# Patient Record
Sex: Male | Born: 1960 | Race: White | Hispanic: No | Marital: Married | Smoking: Never smoker
Health system: Southern US, Community
[De-identification: ages and names within clinical notes are randomized; demographics above are authoritative.]

## PROBLEM LIST (undated history)

## (undated) DIAGNOSIS — T4145XA Adverse effect of unspecified anesthetic, initial encounter: Secondary | ICD-10-CM

## (undated) DIAGNOSIS — K4090 Unilateral inguinal hernia, without obstruction or gangrene, not specified as recurrent: Secondary | ICD-10-CM

## (undated) DIAGNOSIS — Z8489 Family history of other specified conditions: Secondary | ICD-10-CM

## (undated) DIAGNOSIS — T8859XA Other complications of anesthesia, initial encounter: Secondary | ICD-10-CM

## (undated) DIAGNOSIS — E7800 Pure hypercholesterolemia, unspecified: Secondary | ICD-10-CM

## (undated) DIAGNOSIS — N200 Calculus of kidney: Secondary | ICD-10-CM

## (undated) DIAGNOSIS — I2510 Atherosclerotic heart disease of native coronary artery without angina pectoris: Secondary | ICD-10-CM

## (undated) HISTORY — DX: Pure hypercholesterolemia, unspecified: E78.00

## (undated) HISTORY — PX: OTHER SURGICAL HISTORY: SHX169

## (undated) HISTORY — DX: Atherosclerotic heart disease of native coronary artery without angina pectoris: I25.10

## (undated) HISTORY — PX: HERNIA REPAIR: SHX51

## (undated) HISTORY — PX: CARDIAC CATHETERIZATION: SHX172

---

## 2013-07-08 ENCOUNTER — Other Ambulatory Visit: Payer: Self-pay | Admitting: Cardiology

## 2013-07-09 ENCOUNTER — Ambulatory Visit (HOSPITAL_COMMUNITY)
Admission: RE | Admit: 2013-07-09 | Discharge: 2013-07-09 | Disposition: A | Discharge status: Home / Self Care | Payer: 59 | Source: Ambulatory Visit | Attending: Cardiology | Admitting: Cardiology

## 2013-07-09 ENCOUNTER — Encounter (HOSPITAL_COMMUNITY)
Admission: RE | Disposition: A | Discharge status: Home / Self Care | Payer: Self-pay | Source: Ambulatory Visit | Attending: Cardiology

## 2013-07-09 DIAGNOSIS — E669 Obesity, unspecified: Secondary | ICD-10-CM | POA: Diagnosis present

## 2013-07-09 DIAGNOSIS — I209 Angina pectoris, unspecified: Secondary | ICD-10-CM | POA: Insufficient documentation

## 2013-07-09 DIAGNOSIS — R9439 Abnormal result of other cardiovascular function study: Secondary | ICD-10-CM | POA: Diagnosis present

## 2013-07-09 DIAGNOSIS — I208 Other forms of angina pectoris: Secondary | ICD-10-CM | POA: Diagnosis present

## 2013-07-09 DIAGNOSIS — I2089 Other forms of angina pectoris: Secondary | ICD-10-CM | POA: Diagnosis present

## 2013-07-09 DIAGNOSIS — I2510 Atherosclerotic heart disease of native coronary artery without angina pectoris: Secondary | ICD-10-CM | POA: Diagnosis present

## 2013-07-09 DIAGNOSIS — E785 Hyperlipidemia, unspecified: Secondary | ICD-10-CM | POA: Diagnosis present

## 2013-07-09 DIAGNOSIS — E7800 Pure hypercholesterolemia, unspecified: Secondary | ICD-10-CM | POA: Insufficient documentation

## 2013-07-09 DIAGNOSIS — I10 Essential (primary) hypertension: Secondary | ICD-10-CM | POA: Diagnosis present

## 2013-07-09 HISTORY — PX: FRACTIONAL FLOW RESERVE WIRE: SHX5839

## 2013-07-09 HISTORY — PX: LEFT HEART CATHETERIZATION WITH CORONARY ANGIOGRAM: SHX5451

## 2013-07-09 SURGERY — LEFT HEART CATHETERIZATION WITH CORONARY ANGIOGRAM
Anesthesia: LOCAL

## 2013-07-09 MED ORDER — SODIUM CHLORIDE 0.9 % IV SOLN
INTRAVENOUS | Status: DC
  Administered 2013-07-09: 08:00:00 via INTRAVENOUS

## 2013-07-09 MED ORDER — HEPARIN SODIUM (PORCINE) 1000 UNIT/ML IJ SOLN
INTRAMUSCULAR | Status: AC
  Filled 2013-07-09: qty 1

## 2013-07-09 MED ORDER — SODIUM CHLORIDE 0.9 % IV SOLN: INTRAVENOUS | Status: DC

## 2013-07-09 MED ORDER — SODIUM CHLORIDE 0.9 % IV SOLN: 250.0000 mL | INTRAVENOUS | Status: DC | PRN

## 2013-07-09 MED ORDER — NITROGLYCERIN 0.2 MG/ML ON CALL CATH LAB
INTRAVENOUS | Status: AC
  Filled 2013-07-09: qty 1

## 2013-07-09 MED ORDER — DIAZEPAM 5 MG PO TABS
5.0000 mg | ORAL_TABLET | ORAL | Status: AC
  Administered 2013-07-09: 5 mg via ORAL

## 2013-07-09 MED ORDER — MIDAZOLAM HCL 2 MG/2ML IJ SOLN
INTRAMUSCULAR | Status: AC
  Filled 2013-07-09: qty 2

## 2013-07-09 MED ORDER — FENTANYL CITRATE 0.05 MG/ML IJ SOLN
INTRAMUSCULAR | Status: AC
  Filled 2013-07-09: qty 2

## 2013-07-09 MED ORDER — SODIUM CHLORIDE 0.9 % IJ SOLN: 3.0000 mL | INTRAMUSCULAR | Status: DC | PRN

## 2013-07-09 MED ORDER — SODIUM CHLORIDE 0.9 % IJ SOLN: 3.0000 mL | Freq: Two times a day (BID) | INTRAMUSCULAR | Status: DC

## 2013-07-09 MED ORDER — ASPIRIN 81 MG PO CHEW
324.0000 mg | CHEWABLE_TABLET | ORAL | Status: AC
  Administered 2013-07-09: 324 mg via ORAL

## 2013-07-09 MED ORDER — ONDANSETRON HCL 4 MG/2ML IJ SOLN: 4.0000 mg | Freq: Four times a day (QID) | INTRAMUSCULAR | Status: DC | PRN

## 2013-07-09 MED ORDER — LIDOCAINE HCL (PF) 1 % IJ SOLN
INTRAMUSCULAR | Status: AC
  Filled 2013-07-09: qty 30

## 2013-07-09 MED ORDER — DIAZEPAM 5 MG PO TABS
ORAL_TABLET | ORAL | Status: AC
  Administered 2013-07-09: 5 mg via ORAL
  Filled 2013-07-09: qty 1

## 2013-07-09 MED ORDER — ACETAMINOPHEN 325 MG PO TABS: 650.0000 mg | ORAL_TABLET | ORAL | Status: DC | PRN

## 2013-07-09 MED ORDER — ASPIRIN 81 MG PO CHEW
CHEWABLE_TABLET | ORAL | Status: AC
  Administered 2013-07-09: 324 mg via ORAL
  Filled 2013-07-09: qty 4

## 2013-07-09 MED ORDER — ADENOSINE 12 MG/4ML IV SOLN
16.0000 mL | Freq: Once | INTRAVENOUS | Status: DC
  Filled 2013-07-09: qty 16

## 2013-07-09 MED ORDER — VERAPAMIL HCL 2.5 MG/ML IV SOLN
INTRAVENOUS | Status: AC
  Filled 2013-07-09: qty 2

## 2013-07-09 MED ORDER — HEPARIN (PORCINE) IN NACL 2-0.9 UNIT/ML-% IJ SOLN
INTRAMUSCULAR | Status: AC
  Filled 2013-07-09: qty 1500

## 2013-07-09 NOTE — H&P (View-Only) (Signed)
Patient: Juan [name], Holtmeyer Account Number: [id number] Provider: Donato Schultz, MD  DOB: 07-May-1961 Age: 52 Y Sex: Male Date: 07/07/2013  Phone: [phone]   Address: [address], [place], [id number]  Pcp: AARON MORROW          1. ABN TREADMILL.        HPI:  General:  52 year old male referred by Dr. Kateri Plummer for the evaluation of chest pain with exercise treadmill test. He is nondiabetic, no early family history of CAD, nonsmoker. He has been experiencing over the past month central chest discomfort mild to moderate in severity with exertional activity. At one point, going up stairs was triggering the discomfort. He's had some mild associated shortness of breath with this. It is concerning enough for him to obtain exercise treadmill test which was performed today. The exercise treadmill test was abnormal. He exercised for only 3 minutes and 30 seconds suffering from 6/10 chest discomfort central which subsided after 2-3 minutes. He experienced diffuse ST segment depression of greater than 2 mm. He was chest pain-free at the end of the encounter and quite comfortable. His LDL cholesterol was reviewed and medications were reviewed..        ROS:  Denies any fevers, chills, bleeding, orthopnea, strokelike symptoms, abdominal pain. Unless specified above, all other review of systems negative.       Surgical History: hernia repair 1963.        Hospitalization/Major Diagnostic Procedure: see sx hx .        Family History: Father: deceased 11 yrs, car wreckMother: alive 7 yrs, osteoporosis, blockages in her neckSister 1: alive 68 yrs, A + W       Social History:  General:  Tobacco use  cigarettes: Never smoked Tobacco history last updated 06/17/2013 no Alcohol.  Caffeine: yes, coffee, 1 serving daily, soda, occasionally.  no Recreational drug use.  Exercise: nothing structured.  Occupation: Airline pilot.  Marital Status: married.  Children: Boys, 2.        Medications:  Taking Aleve 220 MG Tablet 2 tablet as needed at night, Taking Allegra Allergy ? Tablet 1 tablet Once a day at night as needed, Taking Naproxen 500 MG Tablet 1 tablet as needed every 12 hrs, Taking Flexeril 10 mg 1 tablet 3 times a day as needed, Taking Aspir-81 81 MG Tablet Delayed Release 1 tablet Once a day, Taking Metoprolol Succinate ER 25 mg Tablet Extended Release 24 Hour 1 tablet Once a day, Taking Nitroglycerin 0.4 mg 0.4 mg tablet 1 tablet as directed as directed prn chest pain, Taking Atorvastatin Calcium 40 MG Tablet 1 tablet Once a day          Vitals: Wt 201.4, Ht 66, BMI 32.50, Pulse sitting 84, BP sitting 118/72.       Examination:  General Examination:  GENERAL APPEARANCE alert, oriented, NAD, pleasant.  SKIN: normal, no rash.  HEENT: normal.  HEAD: [place]/AT.  EYES: EOMI, Conjunctiva clear.  NECK: supple, FROM, without evidence of thyromegaly, adenopathy, or bruits, no jugular venous distention (JVD).  LUNGS: clear to auscultation bilaterally, no wheezes, rhonchi, rales, regular breathing rate and effort.  HEART: regular rate and rhythm, no S3, S4, murmur or rub, point of maximul impulse (PMI) normal.  ABDOMEN: soft, non-tender/non-distended, bowel sounds present, no masses palpated, no bruit.  EXTREMITIES: no clubbing, no edema, pulses 2 plus bilaterally.  NEUROLOGIC EXAM: non-focal exam, alert and oriented x 3.  PERIPHERAL PULSES: normal (2+) bilaterally.  LYMPH NODES: no cervical adenopathy.  PSYCH affect  normal.  Weight gathered from prior visit. Prior medical records, lab work, exercise treadmill test/EKG reviewed.           Assessment:  1. Abnormal cardiovascular stress test - 794.39 (Primary)  2. Chest pain - 786.50  3. Pure hypercholesterolemia - 272.0, Migrated  4. Angina of effort - 413.9        1. Abnormal cardiovascular stress test  Notes: Abnormal exercise treadmill test. Exertional angina. ST segment depression concerning for possible triple-vessel  disease. Q waves noted in the inferior leads. Possible old inferior infarct pattern. Poor exercise time. Abnormal blood pressure response with inadequate increase in blood pressure during exercise. Because of all of these factors, I'm going to proceed with heart catheterization. Radial artery approach. Discussed with him the risks and the benefits as above. I have also started him on aspirin 81 mg, metoprolol XL 25 mg, atorvastatin 40 mg, nitroglycerin sublingual when necessary. He is to contact me or seek medical attention if any worrisome symptoms develop.       2. Chest pain  LAB: PT (Prothrombin Time) (161096) LAB: Basic Metabolic LAB: CBC with Diff      3. Pure hypercholesterolemia  Notes: Starting statin.       4. Angina of effort  Notes: Concerning symptoms for coronary artery disease.        Procedures:  Venipuncture:  Venipuncture: Judithann Graves 07/07/2013 04:14:26 PM > , performed in right arm.           Labs:    Lab: PT (Prothrombin Time) (045409) 1.1  Prothrombin Time 10.7  9.1-12.0 - SEC  INR 1.1  0.8-1.2 -    Carlia Bomkamp 07/08/2013 12:20:12 PM > normal.         Lab: Basic Metabolic creatinine 1.38  GLUCOSE 89  70-99 - mg/dL  BUN 18  8-11 - mg/dL  CREATININE 9.14 H 7.82-9.56 - mg/dl  eGFR (NON-AFRICAN AMERICAN) 54 L >60 - calc  eGFR (AFRICAN AMERICAN) 65  >60 - calc  SODIUM 140  136-145 - mmol/L  POTASSIUM 4.5  3.5-5.5 - mmol/L  CHLORIDE 105  98-107 - mmol/L  C02 31  22-32 - mmol/L  ANION GAP 8.6  6.0-20.0 - mmol/L  CALCIUM 10.4 H 8.6-10.3 - mg/dL  BUN/CR ratio 21.3  08.6-57.8 - calc   Jaimey Franchini 07/07/2013 04:59:43 PM > minimally elevated creatinine. We will make sure that he is aggressively hydrating prior to catheterization. Stegall,Amy 07/08/2013 09:02:51 AM > Pts wife notified per dpr.        Lab: CBC with Diff hemoglobin 14.5  WBC 7.5  4.0-11.0 - K/ul  RBC 5.00  4.20-5.80 - M/uL  HGB 14.5  13.0-17.0 - g/dL  HCT 46.9  62.9-52.8 - %  MCH  29.0  27.0-33.0 - pg  MPV 8.0  7.5-10.7 - fL  MCV 85.3  80.0-94.0 - fL  MCHC 34.0  32.0-36.0 - g/dL  RDW 41.3  24.4-01.0 - %  NRBC# 0.01  -   PLT 211  150-400 - K/uL  NEUT % 52.9  43.3-71.9 - %  NRBC% 0.20  - %  LYMPH% 32.1  16.8-43.5 - %  MONO % 11.3  4.6-12.4 - %  EOS % 3.2  0.0-7.8 - %  BASO % 0.5  0.0-1.0 - %  NEUT # 4.0  1.9-7.2 - K/uL  LYMPH# 2.40  1.10-2.70 - K/uL  MONO # 0.8  0.3-0.8 - K/uL  EOS # 0.2  0.0-0.6 - K/uL  BASO # 0.0  0.0-0.1 - K/uL  Jaramiah Bossard 07/07/2013 05:00:41 PM > excellent. Okay for catheterization. Stegall,Amy 07/08/2013 09:02:28 AM > Pt notified. Stegall,Amy 07/08/2013 09:03:25 AM > Pts wife notified per dpr.          Procedure Codes: 16109 ECL BMP, 85025 ECL CBC PLATELET DIFF, 60454 BLOOD COLLECTION ROUTINE VENIPUNCTURE       Follow Up: post cath         Provider: Donato Schultz, MD  Patient: Jamone, Garrido DOB: December 22, 1960 Date: 07/07/2013

## 2013-07-09 NOTE — CV Procedure (Signed)
CARDIAC CATHETERIZATION  PROCEDURE:  Left heart catheterization with selective coronary angiography, left ventriculogram via the radial artery approach.  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 He was started on metoprolol, atorvastatin, aspirin, nitroglycerin when necessary. He was brought in for cardiac catheterization. High risk exercise treadmill test.  The risks, benefits, and details of the procedure were explained to the patient, including possibilities of stroke, heart attack, death, renal impairment, arterial damage, bleeding.  The patient verbalized understanding and wanted to proceed.  Informed written consent was obtained.  PROCEDURE TECHNIQUE:  Allen's test was performed pre-and post procedure and was normal. The right radial artery site was prepped and draped in a sterile fashion. One percent lidocaine was used for local anesthesia. Using the modified Seldinger technique a 5 French hydrophilic sheath was inserted into the radial artery without difficulty. 3 mg of verapamil was administered via the sheath. A Judkins right #4 catheter with the guidance of a Versicore wire was placed in the right coronary cusp and selectively cannulated the right coronary artery. After traversing the aortic arch, 4000 units of heparin IV was administered. A Judkins left #3.5 catheter was used to selectively cannulate the left main artery. Multiple views with hand injection of Omnipaque were obtained. Catheter a pigtail catheter was used to cross into the left ventricle, hemodynamics were obtained, and a left ventriculogram was performed in the RAO position with power injection. Following the procedure, sheath was removed, patient was hemodynamically stable, hemostasis  was maintained with a Terumo T band.   CONTRAST:  Total of 100 ml.    FLOUROSCOPY TIME: 8.5 min.  COMPLICATIONS:  None.    HEMODYNAMICS:  Aortic pressure was 97/71mmHg; LV systolic pressure was ; LVEDP .  There was no gradient between the left ventricle and aorta.    ANGIOGRAPHIC DATA:    Left main: No angiogram to the significant coronary artery disease.  Left anterior descending (LAD): There is moderate disease after the first diagonal branch of approximately 50%. Questionable flow limitation.  Circumflex artery (CIRC): Proximal circumflex is occluded. There are left to left collaterals filling the remainder of the obtuse marginals. Complex lesion.  Right coronary artery (RCA): The distal RCA is occluded just before the bifurcation of the PDA and PL branch. There is slow sluggish flow. Right to right as well as left to right collaterals noted.   LEFT VENTRICULOGRAM:  Left ventricular angiogram was done in the 30 RAO projection and revealed base to mid inferior wall akinesis with an estimated ejection fraction of 50%.   IMPRESSIONS:  Occluded circumflex proximally as well as distal occlusion of RCA with collateral blood flow to each vessel. Complex lesions. LAD with moderate disease. Low normal left ventricular systolic function.  LVEDP 18 mmHg.  There is base to mid inferior wall akinesis. Ejection fraction 50%.  RECOMMENDATION: I reviewed films with interventional cardiology. Dr. [place] will perform fractional flow reserve, FFR of LAD to determine its significance. Based upon this finding, we will contemplate either surgical revascularization or percutaneous. It is likely that the circumflex disease will not be able to be attended to by percutaneous intervention. RCA is a possibility.

## 2013-07-09 NOTE — Interval H&P Note (Signed)
History and Physical Interval Note:  07/09/2013 8:54 AM  Juan [name]  has presented today for surgery, with the diagnosis of Chest pain  The various methods of treatment have been discussed with the patient and family. After consideration of risks, benefits and other options for treatment, the patient has consented to  Procedure(s): LEFT HEART CATHETERIZATION WITH CORONARY ANGIOGRAM (N/A) as a surgical intervention .  The patient's history has been reviewed, patient examined, no change in status, stable for surgery.  I have reviewed the patient's chart and labs.  Questions were answered to the patient's satisfaction.     SKAINS, MARK

## 2013-07-09 NOTE — CV Procedure (Signed)
Fractional flow wire analysis of the LAD.  Indication: 52 year old white male with recent onset of angina. Exercise stress test markedly positive at a low exercise level with marked ST segment changes and 6/10 chest pain. Diagnostic cardiac catheterization performed by Dr. Anne Fu demonstrated three-vessel coronary disease. The left circumflex is occluded proximally. The right coronary had subtotal occlusion in the distal vessel extending into the bifurcation of the PDA and the posterior lateral branch. The LAD had a moderate stenosis in the mid vessel. It was unclear whether the LAD stenosis was hemodynamically significant.  Procedure: The right radial access was used from his diagnostic procedure. The patient was anticoagulated with IV heparin. Once a therapeutic ACT was obtained and XB LAD 4 guide was used to access the left coronary. A pro-water guidewire was placed down the LAD. We next placed a flow wire catheter to cross the lesion in the mid LAD. Using IV adenosine to achieve maximal hyperemia a fractional flow reserve of 0.69 was obtained.  Findings: Significantly abnormal FFR of the LAD indicating that this lesion is hemodynamically significant. Patient will be considered for CABG.   Peter Swaziland MD, [facility]  07/09/2013 10:14 AM

## 2013-07-09 NOTE — H&P (Signed)
  Patient: Juan [name], Juan [name] Account Number: [id number] Provider: Mark Skains, MD  DOB: 07/13/1961 Age: 52 Y Sex: Male Date: 07/07/2013  Phone: [phone]   Address: [address], [place], [place]  Pcp: AARON MORROW          1. ABN TREADMILL.        HPI:  General:  52-year-old male referred by Dr. Morrow for the evaluation of chest pain with exercise treadmill test. He is nondiabetic, no early family history of CAD, nonsmoker. He has been experiencing over the past month central chest discomfort mild to moderate in severity with exertional activity. At one point, going up stairs was triggering the discomfort. He's had some mild associated shortness of breath with this. It is concerning enough for him to obtain exercise treadmill test which was performed today. The exercise treadmill test was abnormal. He exercised for only 3 minutes and 30 seconds suffering from 6/10 chest discomfort central which subsided after 2-3 minutes. He experienced diffuse ST segment depression of greater than 2 mm. He was chest pain-free at the end of the encounter and quite comfortable. His LDL cholesterol was reviewed and medications were reviewed..        ROS:  Denies any fevers, chills, bleeding, orthopnea, strokelike symptoms, abdominal pain. Unless specified above, all other review of systems negative.       Surgical History: hernia repair 1963.        Hospitalization/Major Diagnostic Procedure: see sx hx .        Family History: Father: deceased 65 yrs, car wreckMother: alive 71 yrs, osteoporosis, blockages in her neckSister 1: alive 52 yrs, A + W       Social History:  General:  Tobacco use  cigarettes: Never smoked Tobacco history last updated 06/17/2013 no Alcohol.  Caffeine: yes, coffee, 1 serving daily, soda, occasionally.  no Recreational drug use.  Exercise: nothing structured.  Occupation: accountant.  Marital Status: married.  Children: Boys, 2.        Medications:  Taking Aleve 220 MG Tablet 2 tablet as needed at night, Taking Allegra Allergy ? Tablet 1 tablet Once a day at night as needed, Taking Naproxen 500 MG Tablet 1 tablet as needed every 12 hrs, Taking Flexeril 10 mg 1 tablet 3 times a day as needed, Taking Aspir-81 81 MG Tablet Delayed Release 1 tablet Once a day, Taking Metoprolol Succinate ER 25 mg Tablet Extended Release 24 Hour 1 tablet Once a day, Taking Nitroglycerin 0.4 mg 0.4 mg tablet 1 tablet as directed as directed prn chest pain, Taking Atorvastatin Calcium 40 MG Tablet 1 tablet Once a day          Vitals: Wt 201.4, Ht 66, BMI 32.50, Pulse sitting 84, BP sitting 118/72.       Examination:  General Examination:  GENERAL APPEARANCE alert, oriented, NAD, pleasant.  SKIN: normal, no rash.  HEENT: normal.  HEAD: [place]/AT.  EYES: EOMI, Conjunctiva clear.  NECK: supple, FROM, without evidence of thyromegaly, adenopathy, or bruits, no jugular venous distention (JVD).  LUNGS: clear to auscultation bilaterally, no wheezes, rhonchi, rales, regular breathing rate and effort.  HEART: regular rate and rhythm, no S3, S4, murmur or rub, point of maximul impulse (PMI) normal.  ABDOMEN: soft, non-tender/non-distended, bowel sounds present, no masses palpated, no bruit.  EXTREMITIES: no clubbing, no edema, pulses 2 plus bilaterally.  NEUROLOGIC EXAM: non-focal exam, alert and oriented x 3.  PERIPHERAL PULSES: normal (2+) bilaterally.  LYMPH NODES: no cervical adenopathy.  PSYCH affect   normal.  Weight gathered from prior visit. Prior medical records, lab work, exercise treadmill test/EKG reviewed.           Assessment:  1. Abnormal cardiovascular stress test - 794.39 (Primary)  2. Chest pain - 786.50  3. Pure hypercholesterolemia - 272.0, Migrated  4. Angina of effort - 413.9        1. Abnormal cardiovascular stress test  Notes: Abnormal exercise treadmill test. Exertional angina. ST segment depression concerning for possible triple-vessel  disease. Q waves noted in the inferior leads. Possible old inferior infarct pattern. Poor exercise time. Abnormal blood pressure response with inadequate increase in blood pressure during exercise. Because of all of these factors, I'm going to proceed with heart catheterization. Radial artery approach. Discussed with him the risks and the benefits as above. I have also started him on aspirin 81 mg, metoprolol XL 25 mg, atorvastatin 40 mg, nitroglycerin sublingual when necessary. He is to contact me or seek medical attention if any worrisome symptoms develop.       2. Chest pain  LAB: PT (Prothrombin Time) (005199) LAB: Basic Metabolic LAB: CBC with Diff      3. Pure hypercholesterolemia  Notes: Starting statin.       4. Angina of effort  Notes: Concerning symptoms for coronary artery disease.        Procedures:  Venipuncture:  Venipuncture: Kennedy Dean,Candace 07/07/2013 04:14:26 PM > , performed in right arm.           Labs:    Lab: PT (Prothrombin Time) (005199) 1.1  Prothrombin Time 10.7  9.1-12.0 - SEC  INR 1.1  0.8-1.2 -    SKAINS,MARK 07/08/2013 12:20:12 PM > normal.         Lab: Basic Metabolic creatinine 1.38  GLUCOSE 89  70-99 - mg/dL  BUN 18  6-26 - mg/dL  CREATININE 1.38 H 0.60-1.30 - mg/dl  eGFR (NON-AFRICAN AMERICAN) 54 L >60 - calc  eGFR (AFRICAN AMERICAN) 65  >60 - calc  SODIUM 140  136-145 - mmol/L  POTASSIUM 4.5  3.5-5.5 - mmol/L  CHLORIDE 105  98-107 - mmol/L  C02 31  22-32 - mmol/L  ANION GAP 8.6  6.0-20.0 - mmol/L  CALCIUM 10.4 H 8.6-10.3 - mg/dL  BUN/CR ratio 12.8  12.0-20.0 - calc   SKAINS,MARK 07/07/2013 04:59:43 PM > minimally elevated creatinine. We will make sure that he is aggressively hydrating prior to catheterization. Stegall,Amy 07/08/2013 09:02:51 AM > Pts wife notified per dpr.        Lab: CBC with Diff hemoglobin 14.5  WBC 7.5  4.0-11.0 - K/ul  RBC 5.00  4.20-5.80 - M/uL  HGB 14.5  13.0-17.0 - g/dL  HCT 42.7  39.0-52.0 - %  MCH  29.0  27.0-33.0 - pg  MPV 8.0  7.5-10.7 - fL  MCV 85.3  80.0-94.0 - fL  MCHC 34.0  32.0-36.0 - g/dL  RDW 13.1  11.5-15.5 - %  NRBC# 0.01  -   PLT 211  150-400 - K/uL  NEUT % 52.9  43.3-71.9 - %  NRBC% 0.20  - %  LYMPH% 32.1  16.8-43.5 - %  MONO % 11.3  4.6-12.4 - %  EOS % 3.2  0.0-7.8 - %  BASO % 0.5  0.0-1.0 - %  NEUT # 4.0  1.9-7.2 - K/uL  LYMPH# 2.40  1.10-2.70 - K/uL  MONO # 0.8  0.3-0.8 - K/uL  EOS # 0.2  0.0-0.6 - K/uL  BASO # 0.0  0.0-0.1 - K/uL     SKAINS,MARK 07/07/2013 05:00:41 PM > excellent. Okay for catheterization. Stegall,Amy 07/08/2013 09:02:28 AM > Pt notified. Stegall,Amy 07/08/2013 09:03:25 AM > Pts wife notified per dpr.          Procedure Codes: 80048 ECL BMP, 85025 ECL CBC PLATELET DIFF, 36415 BLOOD COLLECTION ROUTINE VENIPUNCTURE       Follow Up: post cath         Provider: Mark Skains, MD  Patient: Koren, Cavan DOB: 07/29/1961 Date: 07/07/2013       

## 2013-07-12 ENCOUNTER — Other Ambulatory Visit: Payer: Self-pay

## 2013-07-12 ENCOUNTER — Other Ambulatory Visit: Payer: Self-pay | Admitting: *Deleted

## 2013-07-12 DIAGNOSIS — I2510 Atherosclerotic heart disease of native coronary artery without angina pectoris: Secondary | ICD-10-CM | POA: Insufficient documentation

## 2013-07-12 DIAGNOSIS — E7800 Pure hypercholesterolemia, unspecified: Secondary | ICD-10-CM | POA: Insufficient documentation

## 2013-07-13 ENCOUNTER — Encounter (HOSPITAL_COMMUNITY): Payer: Self-pay | Admitting: Pharmacy Technician

## 2013-07-13 ENCOUNTER — Other Ambulatory Visit: Payer: Self-pay

## 2013-07-13 ENCOUNTER — Institutional Professional Consult (permissible substitution) (INDEPENDENT_AMBULATORY_CARE_PROVIDER_SITE_OTHER): Payer: 59 | Admitting: Thoracic Surgery (Cardiothoracic Vascular Surgery)

## 2013-07-13 VITALS — BP 100/65 | HR 72 | Resp 16 | Ht 66.0 in | Wt 196.0 lb

## 2013-07-13 DIAGNOSIS — I208 Other forms of angina pectoris: Secondary | ICD-10-CM

## 2013-07-13 DIAGNOSIS — I2510 Atherosclerotic heart disease of native coronary artery without angina pectoris: Secondary | ICD-10-CM

## 2013-07-13 DIAGNOSIS — I209 Angina pectoris, unspecified: Secondary | ICD-10-CM

## 2013-07-13 NOTE — Progress Notes (Signed)
PCP is Aaron Morrow Referring Provider is Skains, Mark, MD  Chief Complaint  Patient presents with  . Chest Pain    eval for CABG....abnormal stress test...cathed 07/09/13  . Shortness of Breath    HPI: Juan [name] is a 52-year-old gentleman who presents with chief complaint of chest tightness with exertion.  Juan [name] is a 52-year-old gentleman with a history of hypercholesterolemia. He has no history of hypertension, diabetes, tobacco abuse, and also has no family history of CAD. About a month ago he began having pain in his back between his shoulder blades and tightness in his chest with exertion. This most commonly would occur with walking up an incline or up stairs. He saw Dr. Morrow who did an exercise test, which was abnormal. He exercised for only 3 minutes and 30 seconds suffering from 6/10 chest discomfort, which subsided after 2-3 minutes. He experienced diffuse ST segment depression of greater than 2 mm.  He was referred to Dr. Skains who performed cardiac catheterization. It showed severe 3 vessel disease with a 50% LAD and totally occluded RCA and left circumflex. An FFR was performed on the LAD and showed the lesion was hemodynamically significant with an FFR of 0.69.  In addition to his chest tightness he says that he has been feeling very tired and fatigued for the past 6-8 months   Past Medical History  Diagnosis Date  . CAD (coronary artery disease)   . Pure hypercholesterolemia   . Other nonspecific abnormal cardiovascular system function study   . Chest pain, unspecified   . Other and unspecified angina pectoris     Past Surgical History  Procedure Laterality Date  . Hernia repair      1963    Family History  Problem Relation Age of Onset  . Osteoporosis Mother   . Vascular Disease Mother     blockages in her neck  . Stroke Mother     mini    Social History History  Substance Use Topics  . Smoking status: Never Smoker   . Smokeless tobacco: Never  Used  . Alcohol Use: No    Current Outpatient Prescriptions  Medication Sig Dispense Refill  . aspirin EC 81 MG tablet Take 81 mg by mouth daily.      . atorvastatin (LIPITOR) 40 MG tablet Take 40 mg by mouth daily.      . metoprolol succinate (TOPROL-XL) 25 MG 24 hr tablet Take 25 mg by mouth daily.      . naproxen sodium (ANAPROX) 220 MG tablet Take 220 mg by mouth 2 (two) times daily as needed (for pain).      . nitroGLYCERIN (NITROLINGUAL) 0.4 MG/SPRAY spray Place 1 spray under the tongue every 5 (five) minutes as needed for chest pain.       No current facility-administered medications for this visit.    No Known Allergies  Review of Systems  Constitutional: Positive for activity change and fatigue.  Respiratory: Positive for chest tightness and shortness of breath.   Cardiovascular: Negative for palpitations and leg swelling.  Hematological: Does not bruise/bleed easily.  All other systems reviewed and are negative.    BP 100/65  Pulse 72  Resp 16  Ht 5' 6" (1.676 m)  Wt 196 lb (88.905 kg)  BMI 31.65 kg/m2  SpO2 98% Physical Exam  Constitutional: He is oriented to person, place, and time. He appears well-developed and well-nourished. No distress.  HENT:  Head: Normocephalic and atraumatic.  Eyes: EOM are normal. Pupils   are equal, round, and reactive to light.  Neck: Neck supple. No thyromegaly present.  No carotid bruits  Cardiovascular: Normal rate, regular rhythm, normal heart sounds and intact distal pulses.  Exam reveals no gallop and no friction rub.   No murmur heard. Pulmonary/Chest: Effort normal. He has no wheezes. He has no rales.  Abdominal: Soft. There is no tenderness.  Musculoskeletal: He exhibits no edema.  Lymphadenopathy:    He has no cervical adenopathy.  Neurological: He is alert and oriented to person, place, and time. No cranial nerve deficit.  Skin: Skin is warm and dry.     Diagnostic Tests: Cardiac catheterization ANGIOGRAPHIC DATA:   Left main: No angiogram to the significant coronary artery disease.  Left anterior descending (LAD): There is moderate disease after the first diagonal branch of approximately 50%. Questionable flow limitation.  Circumflex artery (CIRC): Proximal circumflex is occluded. There are left to left collaterals filling the remainder of the obtuse marginals. Complex lesion.  Right coronary artery (RCA): The distal RCA is occluded just before the bifurcation of the PDA and PL branch. There is slow sluggish flow. Right to right as well as left to right collaterals noted.  LEFT VENTRICULOGRAM: Left ventricular angiogram was done in the 30 RAO projection and revealed base to mid inferior wall akinesis with an estimated ejection fraction of 50%.  IMPRESSIONS:  1. Occluded circumflex proximally as well as distal occlusion of RCA with collateral blood flow to each vessel. Complex lesions. LAD with moderate disease. 2. Low normal left ventricular systolic function. LVEDP 18 mmHg. There is base to mid inferior wall akinesis. Ejection fraction 50%. RECOMMENDATION: I reviewed films with interventional cardiology. Dr. Jordan will perform fractional flow reserve, FFR of LAD to determine its significance. Based upon this finding, we will contemplate either surgical revascularization or percutaneous. It is likely that the circumflex disease will not be able to be attended to by percutaneous intervention. RCA is a possibility.  Fractional flow reserve Procedure: The right radial access was used from his diagnostic procedure. The patient was anticoagulated with IV heparin. Once a therapeutic ACT was obtained and XB LAD 4 guide was used to access the left coronary. A pro-water guidewire was placed down the LAD. We next placed a flow wire catheter to cross the lesion in the mid LAD. Using IV adenosine to achieve maximal hyperemia a fractional flow reserve of 0.69 was obtained.  Findings: Significantly abnormal FFR of the LAD  indicating that this lesion is hemodynamically significant. Patient will be considered for CABG.   Impression: 52-year-old gentleman with a history of hypercholesterolemia but no other significant cardiovascular risk factors. He presents with exertional angina. He had a positive treadmill. A cardiac catheterization he was found to have severe three-vessel coronary disease with totally occlusion of the right coronary and left circumflex. His LAD had a 50% stenosis which was proven to be hemodynamically significant by FFR.  Coronary artery bypass grafting is indicated for survival benefit and relief of symptoms. He is a good candidate for bilateral mammary grafting.  I had a long discussion with Juan [name] and his wife regarding CABG. We discussed the indications, risks, benefits, and alternatives. We discussed the general nature of the procedure, the need for general anesthesia, and the incisions to be used. We also talked about the expected hospital stay, overall recovery and short and long term outcomes. They understand the risks include, but are not limited to, death, stroke, MI, DVT/PE, bleeding, possible need for transfusion, infections, cardiac arrhythmias,   and other organ system dysfunction including respiratory, renal, or GI complications.   He accepts the risks of surgery and wishes to proceed.   Plan: Coronary bypass grafting x3 with bilateral mammary arteries and endoscopic vein harvest on Friday, October 3. 

## 2013-07-14 ENCOUNTER — Encounter (HOSPITAL_COMMUNITY): Payer: Self-pay | Admitting: Pharmacy Technician

## 2013-07-14 ENCOUNTER — Ambulatory Visit (HOSPITAL_COMMUNITY)
Admission: RE | Admit: 2013-07-14 | Discharge: 2013-07-14 | Disposition: A | Discharge status: Home / Self Care | Payer: 59 | Source: Ambulatory Visit | Attending: Thoracic Surgery (Cardiothoracic Vascular Surgery) | Admitting: Thoracic Surgery (Cardiothoracic Vascular Surgery)

## 2013-07-14 ENCOUNTER — Encounter (HOSPITAL_COMMUNITY): Payer: Self-pay

## 2013-07-14 ENCOUNTER — Encounter (HOSPITAL_COMMUNITY)
Admission: RE | Admit: 2013-07-14 | Discharge: 2013-07-14 | Disposition: A | Discharge status: Home / Self Care | Payer: 59 | Source: Ambulatory Visit | Attending: Thoracic Surgery (Cardiothoracic Vascular Surgery) | Admitting: Thoracic Surgery (Cardiothoracic Vascular Surgery)

## 2013-07-14 ENCOUNTER — Other Ambulatory Visit (HOSPITAL_COMMUNITY): Payer: Self-pay | Admitting: *Deleted

## 2013-07-14 ENCOUNTER — Other Ambulatory Visit: Payer: Self-pay

## 2013-07-14 VITALS — BP 108/71 | HR 74 | Temp 98.6°F | Resp 18 | Ht 66.0 in | Wt 202.1 lb

## 2013-07-14 DIAGNOSIS — R0609 Other forms of dyspnea: Secondary | ICD-10-CM | POA: Insufficient documentation

## 2013-07-14 DIAGNOSIS — Z01818 Encounter for other preprocedural examination: Secondary | ICD-10-CM | POA: Insufficient documentation

## 2013-07-14 DIAGNOSIS — Z01810 Encounter for preprocedural cardiovascular examination: Secondary | ICD-10-CM

## 2013-07-14 DIAGNOSIS — I2510 Atherosclerotic heart disease of native coronary artery without angina pectoris: Secondary | ICD-10-CM

## 2013-07-14 DIAGNOSIS — Z01811 Encounter for preprocedural respiratory examination: Secondary | ICD-10-CM | POA: Insufficient documentation

## 2013-07-14 DIAGNOSIS — R0989 Other specified symptoms and signs involving the circulatory and respiratory systems: Secondary | ICD-10-CM | POA: Insufficient documentation

## 2013-07-14 DIAGNOSIS — Z01812 Encounter for preprocedural laboratory examination: Secondary | ICD-10-CM | POA: Insufficient documentation

## 2013-07-14 HISTORY — DX: Family history of other specified conditions: Z84.89

## 2013-07-14 HISTORY — DX: Calculus of kidney: N20.0

## 2013-07-14 LAB — COMPREHENSIVE METABOLIC PANEL
ALT: 18 U/L (ref 0–53)
AST: 20 U/L (ref 0–37)
Albumin: 4.1 g/dL (ref 3.5–5.2)
Alkaline Phosphatase: 73 U/L (ref 39–117)
BUN: 20 mg/dL (ref 6–23)
CO2: 22 mEq/L (ref 19–32)
Chloride: 104 mEq/L (ref 96–112)
GFR calc Af Amer: 79 mL/min — ABNORMAL LOW (ref >90)
Glucose, Bld: 90 mg/dL (ref 70–99)
Potassium: 4.1 mEq/L (ref 3.5–5.1)
Sodium: 139 mEq/L (ref 135–145)
Total Bilirubin: 0.3 mg/dL (ref 0.3–1.2)
Total Protein: 7.5 g/dL (ref 6.0–8.3)

## 2013-07-14 LAB — CBC
Hemoglobin: 14.2 g/dL (ref 13.0–17.0)
MCHC: 35.1 g/dL (ref 30.0–36.0)
Platelets: 195 10*3/uL (ref 150–400)
RDW: 12.6 % (ref 11.5–15.5)
WBC: 7.4 10*3/uL (ref 4.0–10.5)

## 2013-07-14 LAB — TYPE AND SCREEN: ABO/RH(D): O POS

## 2013-07-14 LAB — BLOOD GAS, ARTERIAL
Bicarbonate: 23.7 mEq/L (ref 20.0–24.0)
FIO2: 0.21 %
Patient temperature: 98.6
pH, Arterial: 7.415 (ref 7.350–7.450)

## 2013-07-14 LAB — PULMONARY FUNCTION TEST

## 2013-07-14 LAB — URINALYSIS, ROUTINE W REFLEX MICROSCOPIC
Bilirubin Urine: NEGATIVE
Ketones, ur: NEGATIVE mg/dL
Leukocytes, UA: NEGATIVE
Nitrite: NEGATIVE
Protein, ur: NEGATIVE mg/dL
pH: 7 (ref 5.0–8.0)

## 2013-07-14 LAB — APTT: aPTT: 30 seconds (ref 24–37)

## 2013-07-14 LAB — PROTIME-INR: INR: 1.1 (ref 0.00–1.49)

## 2013-07-14 MED ORDER — CHLORHEXIDINE GLUCONATE 4 % EX LIQD: 30.0000 mL | CUTANEOUS | Status: DC

## 2013-07-14 NOTE — Progress Notes (Signed)
VASCULAR LAB PRELIMINARY  PRELIMINARY  PRELIMINARY  PRELIMINARY  Pre-op Cardiac Surgery  Carotid Findings:  Bilateral:  1-39% ICA stenosis.  Vertebral artery flow is antegrade.      Upper Extremity Right Left  Brachial Pressures 104  Triphasic  107  Triphasic   Radial Waveforms Triphasic  Triphasic   Ulnar Waveforms Triphasic  Triphasic   Palmar Arch (Allen's Test) Within normal limits  Doppler obliterates with radial compression, normal with ulnar compression       Juan [name], RVT 07/14/2013, 12:57 PM

## 2013-07-14 NOTE — Pre-Procedure Instructions (Signed)
Stan Cantave  07/14/2013   Your procedure is scheduled on:  July 16, 2013 at 7:30 AM   Report to [facility] Main Entrance "A" at 5:30 AM.   Call this number if you have problems the morning of surgery: [phone]   Remember:   Do not eat food or drink liquids after midnight Thursday, 07/15/13   Take these medicines the morning of surgery with A SIP OF WATER: metoprolol succinate (TOPROL-XL), nitroGLYCERIN (NITROLINGUAL) - if needed.     Do not wear jewelry.  Do not wear lotions, powders, or cologne. You may wear deodorant.  Do not shave 48 hours prior to surgery. Men may shave face and neck.  Do not bring valuables to the hospital.  [facility] is not responsible                  for any belongings or valuables.               Contacts, dentures or bridgework may not be worn into surgery.  Leave suitcase in the car. After surgery it may be brought to your room.  For patients admitted to the hospital, discharge time is determined by your                treatment team.           Special Instructions: Shower using CHG 2 nights before surgery and the night before surgery.  If you shower the day of surgery use CHG.  Use special wash - you have one bottle of CHG for all showers.  You should use approximately 1/3 of the bottle for each shower.   Please read over the following fact sheets that you were given: Pain Booklet, Coughing and Deep Breathing, Blood Transfusion Information, MRSA Information and Surgical Site Infection Prevention

## 2013-07-15 ENCOUNTER — Other Ambulatory Visit: Payer: Self-pay | Admitting: *Deleted

## 2013-07-15 DIAGNOSIS — B958 Unspecified staphylococcus as the cause of diseases classified elsewhere: Secondary | ICD-10-CM

## 2013-07-15 MED ORDER — MUPIROCIN CALCIUM 2 % EX CREA: TOPICAL_CREAM | Freq: Two times a day (BID) | CUTANEOUS | Status: DC

## 2013-07-15 MED ORDER — DEXTROSE 5 % IV SOLN
1.5000 g | INTRAVENOUS | Status: AC
  Administered 2013-07-16: .75 g via INTRAVENOUS
  Administered 2013-07-16: 1.5 g via INTRAVENOUS
  Filled 2013-07-15 (×2): qty 1.5

## 2013-07-15 MED ORDER — SODIUM CHLORIDE 0.9 % IV SOLN
INTRAVENOUS | Status: DC
  Filled 2013-07-15: qty 30

## 2013-07-15 MED ORDER — PLASMA-LYTE 148 IV SOLN
INTRAVENOUS | Status: AC
  Administered 2013-07-16: 08:00:00
  Filled 2013-07-15: qty 2.5

## 2013-07-15 MED ORDER — PHENYLEPHRINE HCL 10 MG/ML IJ SOLN
30.0000 ug/min | INTRAVENOUS | Status: DC
  Filled 2013-07-15: qty 2

## 2013-07-15 MED ORDER — POTASSIUM CHLORIDE 2 MEQ/ML IV SOLN
80.0000 meq | INTRAVENOUS | Status: DC
  Filled 2013-07-15: qty 40

## 2013-07-15 MED ORDER — MAGNESIUM SULFATE 50 % IJ SOLN
40.0000 meq | INTRAMUSCULAR | Status: DC
  Filled 2013-07-15: qty 10

## 2013-07-15 MED ORDER — VANCOMYCIN HCL 10 G IV SOLR
1500.0000 mg | INTRAVENOUS | Status: AC
  Administered 2013-07-16: 1500 mg via INTRAVENOUS
  Filled 2013-07-15: qty 1500

## 2013-07-15 MED ORDER — METOPROLOL TARTRATE 12.5 MG HALF TABLET: 12.5000 mg | ORAL_TABLET | Freq: Once | ORAL | Status: DC

## 2013-07-15 MED ORDER — SODIUM CHLORIDE 0.9 % IV SOLN
INTRAVENOUS | Status: AC
  Administered 2013-07-16: 70 mL/h via INTRAVENOUS
  Filled 2013-07-15: qty 40

## 2013-07-15 MED ORDER — DEXTROSE 5 % IV SOLN
750.0000 mg | INTRAVENOUS | Status: DC
  Filled 2013-07-15 (×2): qty 750

## 2013-07-15 MED ORDER — DEXMEDETOMIDINE HCL IN NACL 400 MCG/100ML IV SOLN
0.1000 ug/kg/h | INTRAVENOUS | Status: AC
  Administered 2013-07-16: 0.2 ug/kg/h via INTRAVENOUS
  Filled 2013-07-15: qty 100

## 2013-07-15 MED ORDER — DOPAMINE-DEXTROSE 3.2-5 MG/ML-% IV SOLN
2.0000 ug/kg/min | INTRAVENOUS | Status: DC
  Filled 2013-07-15: qty 250

## 2013-07-15 MED ORDER — EPINEPHRINE HCL 1 MG/ML IJ SOLN
0.5000 ug/min | INTRAVENOUS | Status: DC
  Filled 2013-07-15: qty 4

## 2013-07-15 MED ORDER — SODIUM CHLORIDE 0.9 % IV SOLN
INTRAVENOUS | Status: AC
  Administered 2013-07-16: 1 [IU]/h via INTRAVENOUS
  Filled 2013-07-15: qty 1

## 2013-07-15 MED ORDER — NITROGLYCERIN IN D5W 200-5 MCG/ML-% IV SOLN
2.0000 ug/min | INTRAVENOUS | Status: AC
  Administered 2013-07-16: 20 ug/min via INTRAVENOUS
  Filled 2013-07-15: qty 250

## 2013-07-16 ENCOUNTER — Inpatient Hospital Stay (HOSPITAL_COMMUNITY): Payer: 59

## 2013-07-16 ENCOUNTER — Inpatient Hospital Stay (HOSPITAL_COMMUNITY): Payer: 59 | Admitting: Anesthesiology

## 2013-07-16 ENCOUNTER — Encounter (HOSPITAL_COMMUNITY): Payer: Self-pay | Admitting: Surgery

## 2013-07-16 ENCOUNTER — Inpatient Hospital Stay (HOSPITAL_COMMUNITY)
Admission: RE | Admit: 2013-07-16 | Discharge: 2013-07-21 | DRG: 236 | Disposition: A | Discharge status: Home / Self Care | Payer: 59 | Source: Ambulatory Visit | Attending: Thoracic Surgery (Cardiothoracic Vascular Surgery) | Admitting: Thoracic Surgery (Cardiothoracic Vascular Surgery)

## 2013-07-16 ENCOUNTER — Encounter (HOSPITAL_COMMUNITY): Payer: Self-pay | Admitting: Anesthesiology

## 2013-07-16 ENCOUNTER — Encounter (HOSPITAL_COMMUNITY)
Admission: RE | Disposition: A | Discharge status: Home / Self Care | Payer: 59 | Source: Ambulatory Visit | Attending: Thoracic Surgery (Cardiothoracic Vascular Surgery)

## 2013-07-16 DIAGNOSIS — I2510 Atherosclerotic heart disease of native coronary artery without angina pectoris: Principal | ICD-10-CM | POA: Diagnosis present

## 2013-07-16 DIAGNOSIS — E785 Hyperlipidemia, unspecified: Secondary | ICD-10-CM | POA: Diagnosis present

## 2013-07-16 DIAGNOSIS — D62 Acute posthemorrhagic anemia: Secondary | ICD-10-CM | POA: Diagnosis not present

## 2013-07-16 DIAGNOSIS — Z6832 Body mass index (BMI) 32.0-32.9, adult: Secondary | ICD-10-CM

## 2013-07-16 DIAGNOSIS — I208 Other forms of angina pectoris: Secondary | ICD-10-CM | POA: Diagnosis present

## 2013-07-16 DIAGNOSIS — K5900 Constipation, unspecified: Secondary | ICD-10-CM | POA: Diagnosis not present

## 2013-07-16 DIAGNOSIS — E7800 Pure hypercholesterolemia, unspecified: Secondary | ICD-10-CM | POA: Diagnosis present

## 2013-07-16 DIAGNOSIS — Z951 Presence of aortocoronary bypass graft: Secondary | ICD-10-CM

## 2013-07-16 DIAGNOSIS — E669 Obesity, unspecified: Secondary | ICD-10-CM | POA: Diagnosis present

## 2013-07-16 DIAGNOSIS — J9819 Other pulmonary collapse: Secondary | ICD-10-CM | POA: Diagnosis not present

## 2013-07-16 DIAGNOSIS — I209 Angina pectoris, unspecified: Secondary | ICD-10-CM | POA: Diagnosis present

## 2013-07-16 DIAGNOSIS — I10 Essential (primary) hypertension: Secondary | ICD-10-CM | POA: Diagnosis present

## 2013-07-16 DIAGNOSIS — J90 Pleural effusion, not elsewhere classified: Secondary | ICD-10-CM | POA: Diagnosis not present

## 2013-07-16 HISTORY — PX: CORONARY ARTERY BYPASS GRAFT: SHX141

## 2013-07-16 LAB — CBC
HCT: 32.4 % — ABNORMAL LOW (ref 39.0–52.0)
Hemoglobin: 13.1 g/dL (ref 13.0–17.0)
MCH: 29 pg (ref 26.0–34.0)
MCH: 29.5 pg (ref 26.0–34.0)
MCHC: 34.9 g/dL (ref 30.0–36.0)
MCHC: 35.5 g/dL (ref 30.0–36.0)
Platelets: 173 10*3/uL (ref 150–400)
RBC: 4.44 MIL/uL (ref 4.22–5.81)
RDW: 12.7 % (ref 11.5–15.5)
RDW: 12.7 % (ref 11.5–15.5)
WBC: 13.4 10*3/uL — ABNORMAL HIGH (ref 4.0–10.5)

## 2013-07-16 LAB — POCT I-STAT, CHEM 8
BUN: 17 mg/dL (ref 6–23)
Calcium, Ion: 1.15 mmol/L (ref 1.12–1.23)
Chloride: 108 mEq/L (ref 96–112)
Creatinine, Ser: 1 mg/dL (ref 0.50–1.35)
Creatinine, Ser: 1.1 mg/dL (ref 0.50–1.35)
Glucose, Bld: 81 mg/dL (ref 70–99)
Glucose, Bld: 107 mg/dL — ABNORMAL HIGH (ref 70–99)
HCT: 32 % — ABNORMAL LOW (ref 39.0–52.0)
Hemoglobin: 10.9 g/dL — ABNORMAL LOW (ref 13.0–17.0)
Potassium: 4.4 mEq/L (ref 3.5–5.1)
Sodium: 141 mEq/L (ref 135–145)
Sodium: 142 mEq/L (ref 135–145)
TCO2: 21 mmol/L (ref 0–100)

## 2013-07-16 LAB — HEMOGLOBIN AND HEMATOCRIT, BLOOD
HCT: 27.5 % — ABNORMAL LOW (ref 39.0–52.0)
Hemoglobin: 9.9 g/dL — ABNORMAL LOW (ref 13.0–17.0)

## 2013-07-16 LAB — POCT I-STAT 4, (NA,K, GLUC, HGB,HCT)
Glucose, Bld: 96 mg/dL (ref 70–99)
Glucose, Bld: 85 mg/dL (ref 70–99)
Glucose, Bld: 109 mg/dL — ABNORMAL HIGH (ref 70–99)
HCT: 22 % — ABNORMAL LOW (ref 39.0–52.0)
HCT: 25 % — ABNORMAL LOW (ref 39.0–52.0)
Hemoglobin: 12.6 g/dL — ABNORMAL LOW (ref 13.0–17.0)
Hemoglobin: 11.6 g/dL — ABNORMAL LOW (ref 13.0–17.0)
Hemoglobin: 7.8 g/dL — ABNORMAL LOW (ref 13.0–17.0)
Hemoglobin: 8.5 g/dL — ABNORMAL LOW (ref 13.0–17.0)
Potassium: 3.6 mEq/L (ref 3.5–5.1)
Potassium: 3.9 mEq/L (ref 3.5–5.1)
Potassium: 4.4 mEq/L (ref 3.5–5.1)
Potassium: 4.7 mEq/L (ref 3.5–5.1)
Sodium: 142 mEq/L (ref 135–145)
Sodium: 137 mEq/L (ref 135–145)
Sodium: 140 mEq/L (ref 135–145)

## 2013-07-16 LAB — GLUCOSE, CAPILLARY
Glucose-Capillary: 104 mg/dL — ABNORMAL HIGH (ref 70–99)
Glucose-Capillary: 97 mg/dL (ref 70–99)

## 2013-07-16 LAB — POCT I-STAT 3, ART BLOOD GAS (G3+)
Acid-base deficit: 2 mmol/L (ref 0.0–2.0)
Acid-base deficit: 1 mmol/L (ref 0.0–2.0)
Acid-base deficit: 4 mmol/L — ABNORMAL HIGH (ref 0.0–2.0)
Bicarbonate: 24.5 mEq/L — ABNORMAL HIGH (ref 20.0–24.0)
Bicarbonate: 21.6 mEq/L (ref 20.0–24.0)
O2 Saturation: 99 %
Patient temperature: 35.5
Patient temperature: 37.4
TCO2: 25 mmol/L (ref 0–100)
TCO2: 26 mmol/L (ref 0–100)
TCO2: 23 mmol/L (ref 0–100)
pCO2 arterial: 40.7 mmHg (ref 35.0–45.0)
pCO2 arterial: 45.1 mmHg — ABNORMAL HIGH (ref 35.0–45.0)
pH, Arterial: 7.377 (ref 7.350–7.450)
pH, Arterial: 7.424 (ref 7.350–7.450)
pH, Arterial: 7.343 — ABNORMAL LOW (ref 7.350–7.450)
pH, Arterial: 7.312 — ABNORMAL LOW (ref 7.350–7.450)
pO2, Arterial: 220 mmHg — ABNORMAL HIGH (ref 80.0–100.0)
pO2, Arterial: 145 mmHg — ABNORMAL HIGH (ref 80.0–100.0)
pO2, Arterial: 87 mmHg (ref 80.0–100.0)
pO2, Arterial: 103 mmHg — ABNORMAL HIGH (ref 80.0–100.0)

## 2013-07-16 LAB — CREATININE, SERUM
Creatinine, Ser: 1.14 mg/dL (ref 0.50–1.35)
GFR calc non Af Amer: 71 mL/min — ABNORMAL LOW (ref >90)

## 2013-07-16 LAB — PLATELET COUNT: Platelets: 141 10*3/uL — ABNORMAL LOW (ref 150–400)

## 2013-07-16 LAB — PROTIME-INR: INR: 1.58 — ABNORMAL HIGH (ref 0.00–1.49)

## 2013-07-16 LAB — MAGNESIUM: Magnesium: 2.8 mg/dL — ABNORMAL HIGH (ref 1.5–2.5)

## 2013-07-16 SURGERY — CORONARY ARTERY BYPASS GRAFTING (CABG)
Anesthesia: General | Site: Chest | Wound class: Clean

## 2013-07-16 MED ORDER — ACETAMINOPHEN 650 MG RE SUPP
650.0000 mg | Freq: Once | RECTAL | Status: AC
  Administered 2013-07-16: 650 mg via RECTAL

## 2013-07-16 MED ORDER — ASPIRIN EC 325 MG PO TBEC
325.0000 mg | DELAYED_RELEASE_TABLET | Freq: Every day | ORAL | Status: DC
  Administered 2013-07-17 – 2013-07-20 (×4): 325 mg via ORAL
  Filled 2013-07-16 (×5): qty 1

## 2013-07-16 MED ORDER — SODIUM CHLORIDE 0.9 % IV SOLN: 250.0000 mL | INTRAVENOUS | Status: DC

## 2013-07-16 MED ORDER — NITROGLYCERIN IN D5W 200-5 MCG/ML-% IV SOLN: 0.0000 ug/min | INTRAVENOUS | Status: DC

## 2013-07-16 MED ORDER — VECURONIUM BROMIDE 10 MG IV SOLR
INTRAVENOUS | Status: DC | PRN
  Administered 2013-07-16 (×5): 5 mg via INTRAVENOUS

## 2013-07-16 MED ORDER — DEXMEDETOMIDINE HCL IN NACL 200 MCG/50ML IV SOLN: 0.1000 ug/kg/h | INTRAVENOUS | Status: DC

## 2013-07-16 MED ORDER — METOPROLOL TARTRATE 25 MG/10 ML ORAL SUSPENSION
12.5000 mg | Freq: Two times a day (BID) | ORAL | Status: DC
  Filled 2013-07-16 (×5): qty 5

## 2013-07-16 MED ORDER — INSULIN ASPART 100 UNIT/ML ~~LOC~~ SOLN
0.0000 [IU] | SUBCUTANEOUS | Status: DC
  Administered 2013-07-17 (×2): 2 [IU] via SUBCUTANEOUS

## 2013-07-16 MED ORDER — SODIUM CHLORIDE 0.45 % IV SOLN
INTRAVENOUS | Status: DC
  Administered 2013-07-16: 20 mL/h via INTRAVENOUS

## 2013-07-16 MED ORDER — LACTATED RINGERS IV SOLN
INTRAVENOUS | Status: DC | PRN
  Administered 2013-07-16: 07:00:00 via INTRAVENOUS

## 2013-07-16 MED ORDER — BISACODYL 10 MG RE SUPP
10.0000 mg | Freq: Every day | RECTAL | Status: DC
  Administered 2013-07-20: 10 mg via RECTAL
  Filled 2013-07-16: qty 1

## 2013-07-16 MED ORDER — FAMOTIDINE IN NACL 20-0.9 MG/50ML-% IV SOLN
20.0000 mg | Freq: Two times a day (BID) | INTRAVENOUS | Status: AC
  Administered 2013-07-16: 20 mg via INTRAVENOUS

## 2013-07-16 MED ORDER — MIDAZOLAM HCL 5 MG/5ML IJ SOLN
INTRAMUSCULAR | Status: DC | PRN
  Administered 2013-07-16: 2 mg via INTRAVENOUS
  Administered 2013-07-16: 3 mg via INTRAVENOUS
  Administered 2013-07-16: 4 mg via INTRAVENOUS
  Administered 2013-07-16: 3 mg via INTRAVENOUS
  Administered 2013-07-16 (×2): 2 mg via INTRAVENOUS
  Administered 2013-07-16: 5 mg via INTRAVENOUS
  Administered 2013-07-16: 3 mg via INTRAVENOUS

## 2013-07-16 MED ORDER — ATORVASTATIN CALCIUM 40 MG PO TABS
40.0000 mg | ORAL_TABLET | Freq: Every day | ORAL | Status: DC
  Administered 2013-07-17 – 2013-07-20 (×4): 40 mg via ORAL
  Filled 2013-07-16 (×5): qty 1

## 2013-07-16 MED ORDER — INSULIN REGULAR BOLUS VIA INFUSION
0.0000 [IU] | Freq: Three times a day (TID) | INTRAVENOUS | Status: DC
  Filled 2013-07-16: qty 10

## 2013-07-16 MED ORDER — PROPOFOL 10 MG/ML IV BOLUS
INTRAVENOUS | Status: DC | PRN
  Administered 2013-07-16: 50 mg via INTRAVENOUS

## 2013-07-16 MED ORDER — DOCUSATE SODIUM 100 MG PO CAPS
200.0000 mg | ORAL_CAPSULE | Freq: Every day | ORAL | Status: DC
  Administered 2013-07-17 – 2013-07-20 (×4): 200 mg via ORAL
  Filled 2013-07-16 (×5): qty 2

## 2013-07-16 MED ORDER — LIDOCAINE HCL (CARDIAC) 20 MG/ML IV SOLN
INTRAVENOUS | Status: DC | PRN
  Administered 2013-07-16: 40 mg via INTRAVENOUS

## 2013-07-16 MED ORDER — PROTAMINE SULFATE 10 MG/ML IV SOLN
INTRAVENOUS | Status: DC | PRN
  Administered 2013-07-16: 250 mg via INTRAVENOUS

## 2013-07-16 MED ORDER — ACETAMINOPHEN 160 MG/5ML PO SOLN: 650.0000 mg | Freq: Once | ORAL | Status: AC

## 2013-07-16 MED ORDER — SODIUM CHLORIDE 0.9 % IV SOLN
INTRAVENOUS | Status: DC
  Filled 2013-07-16: qty 1

## 2013-07-16 MED ORDER — VANCOMYCIN HCL IN DEXTROSE 1-5 GM/200ML-% IV SOLN
1000.0000 mg | Freq: Once | INTRAVENOUS | Status: AC
  Administered 2013-07-16: 1000 mg via INTRAVENOUS
  Filled 2013-07-16: qty 200

## 2013-07-16 MED ORDER — PANTOPRAZOLE SODIUM 40 MG PO TBEC
40.0000 mg | DELAYED_RELEASE_TABLET | Freq: Every day | ORAL | Status: DC
  Administered 2013-07-18 – 2013-07-20 (×3): 40 mg via ORAL
  Filled 2013-07-16 (×3): qty 1

## 2013-07-16 MED ORDER — FENTANYL CITRATE 0.05 MG/ML IJ SOLN
INTRAMUSCULAR | Status: DC | PRN
  Administered 2013-07-16: 100 ug via INTRAVENOUS
  Administered 2013-07-16: 250 ug via INTRAVENOUS
  Administered 2013-07-16: 200 ug via INTRAVENOUS
  Administered 2013-07-16 (×4): 250 ug via INTRAVENOUS
  Administered 2013-07-16: 450 ug via INTRAVENOUS
  Administered 2013-07-16: 250 ug via INTRAVENOUS

## 2013-07-16 MED ORDER — MORPHINE SULFATE 2 MG/ML IJ SOLN
2.0000 mg | INTRAMUSCULAR | Status: DC | PRN
  Administered 2013-07-16: 1 mg via INTRAVENOUS
  Administered 2013-07-17 (×2): 2 mg via INTRAVENOUS
  Administered 2013-07-17: 1 mg via INTRAVENOUS
  Administered 2013-07-17 – 2013-07-18 (×2): 2 mg via INTRAVENOUS
  Filled 2013-07-16 (×4): qty 1

## 2013-07-16 MED ORDER — OXYCODONE HCL 5 MG PO TABS
5.0000 mg | ORAL_TABLET | ORAL | Status: DC | PRN
  Administered 2013-07-17: 10 mg via ORAL
  Administered 2013-07-17: 5 mg via ORAL
  Administered 2013-07-17 – 2013-07-18 (×2): 10 mg via ORAL
  Administered 2013-07-18: 5 mg via ORAL
  Administered 2013-07-18 – 2013-07-19 (×2): 10 mg via ORAL
  Administered 2013-07-19: 5 mg via ORAL
  Administered 2013-07-19 – 2013-07-20 (×2): 10 mg via ORAL
  Filled 2013-07-16 (×2): qty 2
  Filled 2013-07-16 (×2): qty 1
  Filled 2013-07-16 (×2): qty 2
  Filled 2013-07-16: qty 1
  Filled 2013-07-16 (×3): qty 2

## 2013-07-16 MED ORDER — MUPIROCIN CALCIUM 2 % EX CREA
TOPICAL_CREAM | Freq: Two times a day (BID) | CUTANEOUS | Status: DC
  Administered 2013-07-16 – 2013-07-18 (×6): via TOPICAL
  Administered 2013-07-19: 1 via TOPICAL
  Administered 2013-07-19 – 2013-07-20 (×3): via TOPICAL
  Filled 2013-07-16: qty 15

## 2013-07-16 MED ORDER — SODIUM CHLORIDE 0.9 % IV SOLN
20.0000 mg | INTRAVENOUS | Status: DC | PRN
  Administered 2013-07-16: 10 ug/min via INTRAVENOUS

## 2013-07-16 MED ORDER — ROCURONIUM BROMIDE 100 MG/10ML IV SOLN
INTRAVENOUS | Status: DC | PRN
  Administered 2013-07-16: 100 mg via INTRAVENOUS

## 2013-07-16 MED ORDER — LACTATED RINGERS IV SOLN: 500.0000 mL | Freq: Once | INTRAVENOUS | Status: AC | PRN

## 2013-07-16 MED ORDER — DOPAMINE-DEXTROSE 3.2-5 MG/ML-% IV SOLN
3.0000 ug/kg/min | INTRAVENOUS | Status: DC
  Administered 2013-07-16: 3 ug/kg/min via INTRAVENOUS

## 2013-07-16 MED ORDER — PHENYLEPHRINE HCL 10 MG/ML IJ SOLN
0.0000 ug/min | INTRAMUSCULAR | Status: DC
  Administered 2013-07-16: 10 ug/min via INTRAVENOUS
  Filled 2013-07-16 (×3): qty 2

## 2013-07-16 MED ORDER — METOPROLOL TARTRATE 1 MG/ML IV SOLN: 2.5000 mg | INTRAVENOUS | Status: DC | PRN

## 2013-07-16 MED ORDER — SODIUM CHLORIDE 0.9 % IJ SOLN
3.0000 mL | Freq: Two times a day (BID) | INTRAMUSCULAR | Status: DC
  Administered 2013-07-17 – 2013-07-20 (×6): 3 mL via INTRAVENOUS

## 2013-07-16 MED ORDER — SODIUM CHLORIDE 0.9 % IJ SOLN
OROMUCOSAL | Status: DC | PRN
  Administered 2013-07-16 (×3): via TOPICAL

## 2013-07-16 MED ORDER — DEXTROSE 5 % IV SOLN
INTRAVENOUS | Status: DC | PRN
  Administered 2013-07-16: 08:00:00 via INTRAVENOUS

## 2013-07-16 MED ORDER — POTASSIUM CHLORIDE 10 MEQ/50ML IV SOLN: 10.0000 meq | INTRAVENOUS | Status: AC

## 2013-07-16 MED ORDER — ALBUMIN HUMAN 5 % IV SOLN
250.0000 mL | INTRAVENOUS | Status: AC | PRN
  Administered 2013-07-16 – 2013-07-17 (×3): 250 mL via INTRAVENOUS
  Filled 2013-07-16: qty 250

## 2013-07-16 MED ORDER — 0.9 % SODIUM CHLORIDE (POUR BTL) OPTIME
TOPICAL | Status: DC | PRN
  Administered 2013-07-16: 6000 mL

## 2013-07-16 MED ORDER — SODIUM CHLORIDE 0.9 % IV SOLN
INTRAVENOUS | Status: DC | PRN
  Administered 2013-07-16: 08:00:00 via INTRAVENOUS

## 2013-07-16 MED ORDER — ACETAMINOPHEN 160 MG/5ML PO SOLN
1000.0000 mg | Freq: Four times a day (QID) | ORAL | Status: DC
  Filled 2013-07-16: qty 40

## 2013-07-16 MED ORDER — SODIUM CHLORIDE 0.9 % IJ SOLN: 3.0000 mL | INTRAMUSCULAR | Status: DC | PRN

## 2013-07-16 MED ORDER — ONDANSETRON HCL 4 MG/2ML IJ SOLN: 4.0000 mg | Freq: Four times a day (QID) | INTRAMUSCULAR | Status: DC | PRN

## 2013-07-16 MED ORDER — MAGNESIUM SULFATE 40 MG/ML IJ SOLN
4.0000 g | Freq: Once | INTRAMUSCULAR | Status: AC
  Administered 2013-07-16: 4 g via INTRAVENOUS
  Filled 2013-07-16: qty 100

## 2013-07-16 MED ORDER — HEPARIN SODIUM (PORCINE) 1000 UNIT/ML IJ SOLN
INTRAMUSCULAR | Status: DC | PRN
  Administered 2013-07-16: 25000 [IU] via INTRAVENOUS
  Administered 2013-07-16: 2000 [IU] via INTRAVENOUS

## 2013-07-16 MED ORDER — MORPHINE SULFATE 2 MG/ML IJ SOLN: 1.0000 mg | INTRAMUSCULAR | Status: AC | PRN

## 2013-07-16 MED ORDER — ALBUMIN HUMAN 5 % IV SOLN
INTRAVENOUS | Status: DC | PRN
  Administered 2013-07-16: 12:00:00 via INTRAVENOUS

## 2013-07-16 MED ORDER — DEXTROSE 5 % IV SOLN
1.5000 g | Freq: Two times a day (BID) | INTRAVENOUS | Status: AC
  Administered 2013-07-16 – 2013-07-18 (×4): 1.5 g via INTRAVENOUS
  Filled 2013-07-16 (×4): qty 1.5

## 2013-07-16 MED ORDER — METOPROLOL TARTRATE 12.5 MG HALF TABLET
12.5000 mg | ORAL_TABLET | Freq: Two times a day (BID) | ORAL | Status: DC
  Administered 2013-07-17: 12.5 mg via ORAL
  Filled 2013-07-16 (×5): qty 1

## 2013-07-16 MED ORDER — BISACODYL 5 MG PO TBEC
10.0000 mg | DELAYED_RELEASE_TABLET | Freq: Every day | ORAL | Status: DC
  Administered 2013-07-17 – 2013-07-19 (×3): 10 mg via ORAL
  Filled 2013-07-16 (×3): qty 2

## 2013-07-16 MED ORDER — MIDAZOLAM HCL 2 MG/2ML IJ SOLN: 2.0000 mg | INTRAMUSCULAR | Status: DC | PRN

## 2013-07-16 MED ORDER — ACETAMINOPHEN 500 MG PO TABS
1000.0000 mg | ORAL_TABLET | Freq: Four times a day (QID) | ORAL | Status: DC
  Administered 2013-07-17 – 2013-07-21 (×17): 1000 mg via ORAL
  Filled 2013-07-16 (×23): qty 2

## 2013-07-16 MED ORDER — SODIUM CHLORIDE 0.9 % IV SOLN
INTRAVENOUS | Status: DC
  Administered 2013-07-16: 20 mL/h via INTRAVENOUS

## 2013-07-16 MED ORDER — HEMOSTATIC AGENTS (NO CHARGE) OPTIME
TOPICAL | Status: DC | PRN
  Administered 2013-07-16: 1 via TOPICAL

## 2013-07-16 MED ORDER — ARTIFICIAL TEARS OP OINT
TOPICAL_OINTMENT | OPHTHALMIC | Status: DC | PRN
  Administered 2013-07-16: 1 via OPHTHALMIC

## 2013-07-16 MED ORDER — ASPIRIN 81 MG PO CHEW: 324.0000 mg | CHEWABLE_TABLET | Freq: Every day | ORAL | Status: DC

## 2013-07-16 MED ORDER — LACTATED RINGERS IV SOLN
INTRAVENOUS | Status: DC
  Administered 2013-07-16: 20 mL/h via INTRAVENOUS

## 2013-07-16 MED ORDER — VECURONIUM BROMIDE 10 MG IV SOLR: INTRAVENOUS | Status: DC | PRN

## 2013-07-16 SURGICAL SUPPLY — 88 items
ATTRACTOMAT 16X20 MAGNETIC DRP (DRAPES) ×2 IMPLANT
BAG DECANTER FOR FLEXI CONT (MISCELLANEOUS) ×2 IMPLANT
BANDAGE ELASTIC 4 VELCRO ST LF (GAUZE/BANDAGES/DRESSINGS) ×2 IMPLANT
BANDAGE ELASTIC 6 VELCRO ST LF (GAUZE/BANDAGES/DRESSINGS) ×2 IMPLANT
BANDAGE GAUZE 4  KLING STR (GAUZE/BANDAGES/DRESSINGS) ×2 IMPLANT
BANDAGE GAUZE ELAST BULKY 4 IN (GAUZE/BANDAGES/DRESSINGS) ×2 IMPLANT
BASKET HEART (ORDER IN 25'S) (MISCELLANEOUS) ×1
BASKET HEART (ORDER IN 25S) (MISCELLANEOUS) ×1 IMPLANT
BLADE STERNUM SYSTEM 6 (BLADE) ×2 IMPLANT
CANISTER SUCTION 2500CC (MISCELLANEOUS) ×2 IMPLANT
CANNULA EZ GLIDE AORTIC 21FR (CANNULA) ×2 IMPLANT
CANNULA VENOUS LOW PROF 34X46 (CANNULA) IMPLANT
CANNULA VESSEL W/WING W/VALVE (CANNULA) ×6 IMPLANT
CATH CPB KIT HENDRICKSON (MISCELLANEOUS) ×2 IMPLANT
CATH ROBINSON RED A/P 18FR (CATHETERS) ×2 IMPLANT
CATH THORACIC 36FR (CATHETERS) ×2 IMPLANT
CATH THORACIC 36FR RT ANG (CATHETERS) ×2 IMPLANT
CLIP TI MEDIUM 24 (CLIP) IMPLANT
CLIP TI WIDE RED SMALL 24 (CLIP) ×6 IMPLANT
COVER SURGICAL LIGHT HANDLE (MISCELLANEOUS) ×2 IMPLANT
CRADLE DONUT ADULT HEAD (MISCELLANEOUS) IMPLANT
DRAPE CARDIOVASCULAR INCISE (DRAPES) ×1
DRAPE SLUSH/WARMER DISC (DRAPES) ×2 IMPLANT
DRAPE SRG 135X102X78XABS (DRAPES) ×1 IMPLANT
DRSG COVADERM 4X14 (GAUZE/BANDAGES/DRESSINGS) ×2 IMPLANT
DRSG COVADERM 4X8 (GAUZE/BANDAGES/DRESSINGS) ×2 IMPLANT
ELECT REM PT RETURN 9FT ADLT (ELECTROSURGICAL) ×4
ELECTRODE REM PT RTRN 9FT ADLT (ELECTROSURGICAL) ×2 IMPLANT
GLOVE BIO SURGEON STRL SZ 6.5 (GLOVE) ×20 IMPLANT
GLOVE BIOGEL PI IND STRL 6.5 (GLOVE) ×4 IMPLANT
GLOVE BIOGEL PI IND STRL 7.0 (GLOVE) ×4 IMPLANT
GLOVE BIOGEL PI INDICATOR 6.5 (GLOVE) ×4
GLOVE BIOGEL PI INDICATOR 7.0 (GLOVE) ×4
GLOVE EUDERMIC 7 POWDERFREE (GLOVE) ×6 IMPLANT
GOWN PREVENTION PLUS XLARGE (GOWN DISPOSABLE) ×4 IMPLANT
GOWN STRL NON-REIN LRG LVL3 (GOWN DISPOSABLE) ×20 IMPLANT
HEMOSTAT POWDER SURGIFOAM 1G (HEMOSTASIS) ×6 IMPLANT
HEMOSTAT SURGICEL 2X14 (HEMOSTASIS) ×2 IMPLANT
INSERT FOGARTY XLG (MISCELLANEOUS) IMPLANT
KIT BASIN OR (CUSTOM PROCEDURE TRAY) ×2 IMPLANT
KIT ROOM TURNOVER OR (KITS) ×2 IMPLANT
KIT SUCTION CATH 14FR (SUCTIONS) ×4 IMPLANT
KIT VASOVIEW W/TROCAR VH 2000 (KITS) ×2 IMPLANT
MARKER GRAFT CORONARY BYPASS (MISCELLANEOUS) ×6 IMPLANT
NS IRRIG 1000ML POUR BTL (IV SOLUTION) ×12 IMPLANT
PACK OPEN HEART (CUSTOM PROCEDURE TRAY) ×2 IMPLANT
PAD ARMBOARD 7.5X6 YLW CONV (MISCELLANEOUS) ×4 IMPLANT
PAD ELECT DEFIB RADIOL ZOLL (MISCELLANEOUS) ×2 IMPLANT
PENCIL BUTTON HOLSTER BLD 10FT (ELECTRODE) ×2 IMPLANT
PUNCH AORTIC ROTATE 4.0MM (MISCELLANEOUS) ×2 IMPLANT
PUNCH AORTIC ROTATE 4.5MM 8IN (MISCELLANEOUS) IMPLANT
PUNCH AORTIC ROTATE 5MM 8IN (MISCELLANEOUS) IMPLANT
SPONGE GAUZE 4X4 12PLY (GAUZE/BANDAGES/DRESSINGS) ×4 IMPLANT
SPONGE LAP 4X18 X RAY DECT (DISPOSABLE) ×2 IMPLANT
SUT BONE WAX W31G (SUTURE) ×2 IMPLANT
SUT MNCRL AB 4-0 PS2 18 (SUTURE) IMPLANT
SUT PROLENE 3 0 SH DA (SUTURE) ×2 IMPLANT
SUT PROLENE 4 0 RB 1 (SUTURE) ×2
SUT PROLENE 4 0 SH DA (SUTURE) IMPLANT
SUT PROLENE 4-0 RB1 .5 CRCL 36 (SUTURE) ×2 IMPLANT
SUT PROLENE 6 0 C 1 30 (SUTURE) ×12 IMPLANT
SUT PROLENE 7 0 BV 1 (SUTURE) ×12 IMPLANT
SUT PROLENE 7 0 BV1 MDA (SUTURE) ×2 IMPLANT
SUT PROLENE 8 0 BV175 6 (SUTURE) IMPLANT
SUT SILK  1 MH (SUTURE) ×1
SUT SILK 1 MH (SUTURE) ×1 IMPLANT
SUT STEEL 6MS V (SUTURE) ×2 IMPLANT
SUT STEEL STERNAL CCS#1 18IN (SUTURE) IMPLANT
SUT STEEL SZ 6 DBL 3X14 BALL (SUTURE) ×2 IMPLANT
SUT VIC AB 1 CTX 36 (SUTURE) ×2
SUT VIC AB 1 CTX36XBRD ANBCTR (SUTURE) ×2 IMPLANT
SUT VIC AB 2-0 CT1 27 (SUTURE) ×1
SUT VIC AB 2-0 CT1 TAPERPNT 27 (SUTURE) ×1 IMPLANT
SUT VIC AB 2-0 CTX 27 (SUTURE) IMPLANT
SUT VIC AB 3-0 SH 27 (SUTURE)
SUT VIC AB 3-0 SH 27X BRD (SUTURE) IMPLANT
SUT VIC AB 3-0 X1 27 (SUTURE) ×2 IMPLANT
SUT VICRYL 4-0 PS2 18IN ABS (SUTURE) IMPLANT
SUTURE E-PAK OPEN HEART (SUTURE) ×2 IMPLANT
SYSTEM SAHARA CHEST DRAIN ATS (WOUND CARE) ×2 IMPLANT
TAPE CLOTH SURG 4X10 WHT LF (GAUZE/BANDAGES/DRESSINGS) ×2 IMPLANT
TOWEL OR 17X24 6PK STRL BLUE (TOWEL DISPOSABLE) ×4 IMPLANT
TOWEL OR 17X26 10 PK STRL BLUE (TOWEL DISPOSABLE) ×4 IMPLANT
TRAY FOLEY IC TEMP SENS 14FR (CATHETERS) ×2 IMPLANT
TUBE FEEDING 8FR 16IN STR KANG (MISCELLANEOUS) ×2 IMPLANT
TUBING INSUFFLATION 10FT LAP (TUBING) ×2 IMPLANT
UNDERPAD 30X30 INCONTINENT (UNDERPADS AND DIAPERS) ×2 IMPLANT
WATER STERILE IRR 1000ML POUR (IV SOLUTION) ×4 IMPLANT

## 2013-07-16 NOTE — Progress Notes (Signed)
Dr Dorris Fetch updated pt on rate of 4, 40% FiO2, beginning to wean pt from ventilator, RASS-2, pt following some commands, precedex off. Will continue to monitor. Koren Bound

## 2013-07-16 NOTE — Anesthesia Procedure Notes (Signed)
Procedure Name: Intubation Date/Time: 07/16/2013 7:50 AM Performed by: Wray Kearns A Pre-anesthesia Checklist: Patient identified, Timeout performed, Emergency Drugs available, Suction available and Patient being monitored Patient Re-evaluated:Patient Re-evaluated prior to inductionOxygen Delivery Method: Circle system utilized Preoxygenation: Pre-oxygenation with 100% oxygen Intubation Type: IV induction and Cricoid Pressure applied Ventilation: Mask ventilation without difficulty and Oral airway inserted - appropriate to patient size Laryngoscope Size: Mac and 4 Grade View: Grade I Tube type: Oral Tube size: 8.0 mm Number of attempts: 1 Airway Equipment and Method: Stylet Placement Confirmation: ETT inserted through vocal cords under direct vision,  breath sounds checked- equal and bilateral and positive ETCO2 Secured at: 23 cm Tube secured with: Tape Dental Injury: Teeth and Oropharynx as per pre-operative assessment

## 2013-07-16 NOTE — Anesthesia Postprocedure Evaluation (Signed)
  Anesthesia Post-op Note  Patient: Juan [name]  Procedure(s) Performed: Procedure(s) with comments: CORONARY ARTERY BYPASS GRAFTING times using bilateral internal mammary arteries and right leg saphenous vein harvested endoscopically. (N/A) - BIMA, EVH  Patient Location: ICU  Anesthesia Type:General  Level of Consciousness: sedated and unresponsive  Airway and Oxygen Therapy: Patient remains intubated per anesthesia plan  Post-op Pain: none  Post-op Assessment: Post-op Vital signs reviewed, Patient's Cardiovascular Status Stable and Respiratory Function Stable  Post-op Vital Signs: Reviewed and stable  Complications: No apparent anesthesia complications

## 2013-07-16 NOTE — Interval H&P Note (Signed)
History and Physical Interval Note:  07/16/2013 7:24 AM  Juan [name]  has presented today for surgery, with the diagnosis of CAD  The various methods of treatment have been discussed with the patient and family. After consideration of risks, benefits and other options for treatment, the patient has consented to  Procedure(s) with comments: CORONARY ARTERY BYPASS GRAFTING (CABG) (N/A) - BIMA, EVH as a surgical intervention .  The patient's history has been reviewed, patient examined, no change in status, stable for surgery.  I have reviewed the patient's chart and labs.  Questions were answered to the patient's satisfaction.     [name],Roshan C

## 2013-07-16 NOTE — Progress Notes (Signed)
Patient informed Nurse that he took Metoprolol last night at 2100. Vital signs were taken this morning and blood pressure was 108/70 and heart rate was 68. Nurse paged Dr. Dorris Fetch to determine if ordered 12.5 mg dose of Metoprolol should be given. Dr. Dorris Fetch has not returned page. Will leave a note on the chart for anesthesia and give Metoprolol IF needed. Patient and wife updated on plan of care.

## 2013-07-16 NOTE — Transfer of Care (Signed)
Immediate Anesthesia Transfer of Care Note  Patient: Juan [name]  Procedure(s) Performed: Procedure(s) with comments: CORONARY ARTERY BYPASS GRAFTING times using bilateral internal mammary arteries and right leg saphenous vein harvested endoscopically. (N/A) - BIMA, EVH  Patient Location: SICU  Anesthesia Type:General  Level of Consciousness: Patient remains intubated per anesthesia plan  Airway & Oxygen Therapy: Patient remains intubated per anesthesia plan and Patient placed on Ventilator (see vital sign flow sheet for setting)  Post-op Assessment: Report given to PACU RN and Post -op Vital signs reviewed and stable  Post vital signs: Reviewed and stable  Complications: No apparent anesthesia complications

## 2013-07-16 NOTE — Progress Notes (Signed)
Dr Dorris Fetch to bedside. MD updated CI<1.8. Neo gtt at . Albumin x2 given. PAD 18-22. AAI at 90, MD order to start dopamine gtt at to help increase CI. Weaning precedex, pt not yet awake and following commands. Will continue to monitor. Koren Bound

## 2013-07-16 NOTE — Preoperative (Signed)
Beta Blockers   Reason not to administer Beta Blockers:Pt took 25 mg Metoprolol@2100  hrs on 07/15/2013

## 2013-07-16 NOTE — H&P (View-Only) (Signed)
PCP is Farris Has Referring Provider is Donato Schultz, MD  Chief Complaint  Patient presents with  . Chest Pain    eval for CABG....abnormal stress test...cathed 07/09/13  . Shortness of Breath    HPI: [name] is a 52 year old gentleman who presents with chief complaint of chest tightness with exertion.  Mr. Levene is a 52 year old gentleman with a history of hypercholesterolemia. He has no history of hypertension, diabetes, tobacco abuse, and also has no family history of CAD. About a month ago he began having pain in his back between his shoulder blades and tightness in his chest with exertion. This most commonly would occur with walking up an incline or up stairs. He saw Dr. Kateri Plummer who did an exercise test, which was abnormal. He exercised for only 3 minutes and 30 seconds suffering from 6/10 chest discomfort, which subsided after 2-3 minutes. He experienced diffuse ST segment depression of greater than 2 mm.  He was referred to Dr. Anne Fu who performed cardiac catheterization. It showed severe 3 vessel disease with a 50% LAD and totally occluded RCA and left circumflex. An FFR was performed on the LAD and showed the lesion was hemodynamically significant with an FFR of 0.69.  In addition to his chest tightness he says that he has been feeling very tired and fatigued for the past 6-8 months   Past Medical History  Diagnosis Date  . CAD (coronary artery disease)   . Pure hypercholesterolemia   . Other nonspecific abnormal cardiovascular system function study   . Chest pain, unspecified   . Other and unspecified angina pectoris     Past Surgical History  Procedure Laterality Date  . Hernia repair      1963    Family History  Problem Relation Age of Onset  . Osteoporosis Mother   . Vascular Disease Mother     blockages in her neck  . Stroke Mother     mini    Social History History  Substance Use Topics  . Smoking status: Never Smoker   . Smokeless tobacco: Never  Used  . Alcohol Use: No    Current Outpatient Prescriptions  Medication Sig Dispense Refill  . aspirin EC 81 MG tablet Take 81 mg by mouth daily.      Marland Kitchen atorvastatin (LIPITOR) 40 MG tablet Take 40 mg by mouth daily.      . metoprolol succinate (TOPROL-XL) 25 MG 24 hr tablet Take 25 mg by mouth daily.      . naproxen sodium (ANAPROX) 220 MG tablet Take 220 mg by mouth 2 (two) times daily as needed (for pain).      . nitroGLYCERIN (NITROLINGUAL) 0.4 MG/SPRAY spray Place 1 spray under the tongue every 5 (five) minutes as needed for chest pain.       No current facility-administered medications for this visit.    No Known Allergies  Review of Systems  Constitutional: Positive for activity change and fatigue.  Respiratory: Positive for chest tightness and shortness of breath.   Cardiovascular: Negative for palpitations and leg swelling.  Hematological: Does not bruise/bleed easily.  All other systems reviewed and are negative.    BP 100/65  Pulse 72  Resp 16  Ht 5\' 6"  (1.676 m)  Wt 196 lb (88.905 kg)  BMI 31.65 kg/m2  SpO2 98% Physical Exam  Constitutional: He is oriented to person, place, and time. He appears well-developed and well-nourished. No distress.  HENT:  Head: Normocephalic and atraumatic.  Eyes: EOM are normal. Pupils  are equal, round, and reactive to light.  Neck: Neck supple. No thyromegaly present.  No carotid bruits  Cardiovascular: Normal rate, regular rhythm, normal heart sounds and intact distal pulses.  Exam reveals no gallop and no friction rub.   No murmur heard. Pulmonary/Chest: Effort normal. He has no wheezes. He has no rales.  Abdominal: Soft. There is no tenderness.  Musculoskeletal: He exhibits no edema.  Lymphadenopathy:    He has no cervical adenopathy.  Neurological: He is alert and oriented to person, place, and time. No cranial nerve deficit.  Skin: Skin is warm and dry.     Diagnostic Tests: Cardiac catheterization ANGIOGRAPHIC DATA:   Left main: No angiogram to the significant coronary artery disease.  Left anterior descending (LAD): There is moderate disease after the first diagonal branch of approximately 50%. Questionable flow limitation.  Circumflex artery (CIRC): Proximal circumflex is occluded. There are left to left collaterals filling the remainder of the obtuse marginals. Complex lesion.  Right coronary artery (RCA): The distal RCA is occluded just before the bifurcation of the PDA and PL branch. There is slow sluggish flow. Right to right as well as left to right collaterals noted.  LEFT VENTRICULOGRAM: Left ventricular angiogram was done in the 30 RAO projection and revealed base to mid inferior wall akinesis with an estimated ejection fraction of 50%.  IMPRESSIONS:  1. Occluded circumflex proximally as well as distal occlusion of RCA with collateral blood flow to each vessel. Complex lesions. LAD with moderate disease. 2. Low normal left ventricular systolic function. LVEDP 18 mmHg. There is base to mid inferior wall akinesis. Ejection fraction 50%. RECOMMENDATION: I reviewed films with interventional cardiology. Dr. [place] will perform fractional flow reserve, FFR of LAD to determine its significance. Based upon this finding, we will contemplate either surgical revascularization or percutaneous. It is likely that the circumflex disease will not be able to be attended to by percutaneous intervention. RCA is a possibility.  Fractional flow reserve Procedure: The right radial access was used from his diagnostic procedure. The patient was anticoagulated with IV heparin. Once a therapeutic ACT was obtained and XB LAD 4 guide was used to access the left coronary. A pro-water guidewire was placed down the LAD. We next placed a flow wire catheter to cross the lesion in the mid LAD. Using IV adenosine to achieve maximal hyperemia a fractional flow reserve of 0.69 was obtained.  Findings: Significantly abnormal FFR of the LAD  indicating that this lesion is hemodynamically significant. Patient will be considered for CABG.   Impression: 52 year old gentleman with a history of hypercholesterolemia but no other significant cardiovascular risk factors. He presents with exertional angina. He had a positive treadmill. A cardiac catheterization he was found to have severe three-vessel coronary disease with totally occlusion of the right coronary and left circumflex. His LAD had a 50% stenosis which was proven to be hemodynamically significant by FFR.  Coronary artery bypass grafting is indicated for survival benefit and relief of symptoms. He is a good candidate for bilateral mammary grafting.  I had a long discussion with Mr. Berg and his wife regarding CABG. We discussed the indications, risks, benefits, and alternatives. We discussed the general nature of the procedure, the need for general anesthesia, and the incisions to be used. We also talked about the expected hospital stay, overall recovery and short and long term outcomes. They understand the risks include, but are not limited to, death, stroke, MI, DVT/PE, bleeding, possible need for transfusion, infections, cardiac arrhythmias,  and other organ system dysfunction including respiratory, renal, or GI complications.   He accepts the risks of surgery and wishes to proceed.   Plan: Coronary bypass grafting x3 with bilateral mammary arteries and endoscopic vein harvest on Friday, October 3.

## 2013-07-16 NOTE — Brief Op Note (Addendum)
1     [address]       [place] [address]             [phone]     07/16/2013  11:46 AM  PATIENT:  Juan [name]  52 y.o. male  PRE-OPERATIVE DIAGNOSIS:  CAD  POST-OPERATIVE DIAGNOSIS:  CAD  PROCEDURE:  Procedure(s):CABGX4 LIMA-LAD; FREE RIMA-OM; SVG(SEQ)-PD-PL EVH RIGHT THIGH   Surgeon(s): Loreli Slot, MD  PHYSICIAN ASSISTANT: WAYNE GOLD PA-C  ANESTHESIA:   general  PATIENT CONDITION:  ICU - intubated and hemodynamically stable.  PRE-OPERATIVE WEIGHT: 91kg  COMPLICATIONS: NO KNOWN  XC= 77 min CPB= 120 min  Good quality conduits and targets

## 2013-07-16 NOTE — Progress Notes (Signed)
PM ROUNDS  Still sedated on ventilator, slow to wake up  BP 85/52  Pulse 90  Temp(Src) 99.1 F (37.3 C) (Core (Comment))  Resp 0  Wt 202 lb 2 oz (91.683 kg)  BMI 32.64 kg/m2  SpO2 99%  CI > 2 with dopamine and additional volume   Intake/Output Summary (Last 24 hours) at 07/16/13 1841 Last data filed at 07/16/13 1800  Gross per 24 hour  Intake 5586.97 ml  Output   4863 ml  Net 723.97 ml   Hct 32  K 4.4  Stable early postop

## 2013-07-16 NOTE — Procedures (Signed)
Extubation Procedure Note  Patient Details:   Name: Juan [name] DOB: 21-Oct-1960 MRN: [id number]   Airway Documentation:  Pt extubated following Wean Protocol. Pre extuabtion: NIF: -22, VC 850, positive cuff leak, ABG in normal range. Post extubation: Pt placed on 4L [place] Sats 97%, pt able to verbalize name/location. No stridor noted. Pt is resting at this time. RT will continue to monitor.   Evaluation  O2 sats: stable throughout Complications: No apparent complications Patient did tolerate procedure well. Bilateral Breath Sounds: Clear;Diminished   Yes  Elmer Picker 07/16/2013, 7:39 PM

## 2013-07-16 NOTE — Progress Notes (Signed)
Dr Dorris Fetch to bedside post op. Updated pt on nitro, insulin, and precedex gtts. Updated CI <1.8, will give albumin. Updated NSR in 70-80s. MD reviewed initial istat labs (ABG and chemistry), CBC and PT sent to lab. MD updated sbp >90. Will continue to monitor. Koren Bound

## 2013-07-16 NOTE — Anesthesia Preprocedure Evaluation (Addendum)
Anesthesia Evaluation  Patient identified by MRN, date of birth, ID band Patient awake    Reviewed: Allergy & Precautions, H&P , NPO status , Patient's Chart, lab work & pertinent test results, reviewed documented beta blocker date and time   Airway Mallampati: III TM Distance: >3 FB Neck ROM: Full    Dental no notable dental hx. (+) Poor Dentition and Dental Advisory Given   Pulmonary shortness of breath,  breath sounds clear to auscultation  Pulmonary exam normal       Cardiovascular hypertension, On Medications and On Home Beta Blockers + angina + CAD Rhythm:Regular Rate:Normal     Neuro/Psych negative neurological ROS  negative psych ROS   GI/Hepatic negative GI ROS, Neg liver ROS,   Endo/Other  negative endocrine ROS  Renal/GU Renal disease  negative genitourinary   Musculoskeletal   Abdominal   Peds  Hematology negative hematology ROS (+)   Anesthesia Other Findings   Reproductive/Obstetrics negative OB ROS                          Anesthesia Physical Anesthesia Plan  ASA: IV  Anesthesia Plan: General   Post-op Pain Management:    Induction: Intravenous  Airway Management Planned: Oral ETT  Additional Equipment: Arterial line, CVP, PA Cath and Ultrasound Guidance Line Placement  Intra-op Plan:   Post-operative Plan: Post-operative intubation/ventilation  Informed Consent: I have reviewed the patients History and Physical, chart, labs and discussed the procedure including the risks, benefits and alternatives for the proposed anesthesia with the patient or authorized representative who has indicated his/her understanding and acceptance.   Dental advisory given  Plan Discussed with: CRNA  Anesthesia Plan Comments:         Anesthesia Quick Evaluation

## 2013-07-17 ENCOUNTER — Inpatient Hospital Stay (HOSPITAL_COMMUNITY): Payer: 59

## 2013-07-17 DIAGNOSIS — I2510 Atherosclerotic heart disease of native coronary artery without angina pectoris: Principal | ICD-10-CM

## 2013-07-17 LAB — CBC
HCT: 33.8 % — ABNORMAL LOW (ref 39.0–52.0)
HCT: 31.2 % — ABNORMAL LOW (ref 39.0–52.0)
MCH: 29.4 pg (ref 26.0–34.0)
MCH: 29.2 pg (ref 26.0–34.0)
MCHC: 35.5 g/dL (ref 30.0–36.0)
MCHC: 34.9 g/dL (ref 30.0–36.0)
MCV: 82.8 fL (ref 78.0–100.0)
MCV: 83.6 fL (ref 78.0–100.0)
Platelets: 177 10*3/uL (ref 150–400)
Platelets: 120 10*3/uL — ABNORMAL LOW (ref 150–400)
RBC: 3.73 MIL/uL — ABNORMAL LOW (ref 4.22–5.81)
RDW: 12.9 % (ref 11.5–15.5)
RDW: 12.9 % (ref 11.5–15.5)
WBC: 13.6 10*3/uL — ABNORMAL HIGH (ref 4.0–10.5)

## 2013-07-17 LAB — GLUCOSE, CAPILLARY
Glucose-Capillary: 113 mg/dL — ABNORMAL HIGH (ref 70–99)
Glucose-Capillary: 116 mg/dL — ABNORMAL HIGH (ref 70–99)
Glucose-Capillary: 119 mg/dL — ABNORMAL HIGH (ref 70–99)
Glucose-Capillary: 120 mg/dL — ABNORMAL HIGH (ref 70–99)
Glucose-Capillary: 144 mg/dL — ABNORMAL HIGH (ref 70–99)

## 2013-07-17 LAB — BASIC METABOLIC PANEL
BUN: 14 mg/dL (ref 6–23)
Creatinine, Ser: 1.09 mg/dL (ref 0.50–1.35)
GFR calc Af Amer: 87 mL/min — ABNORMAL LOW (ref >90)
GFR calc non Af Amer: 75 mL/min — ABNORMAL LOW (ref >90)
Potassium: 4 mEq/L (ref 3.5–5.1)

## 2013-07-17 LAB — POCT I-STAT, CHEM 8
BUN: 18 mg/dL (ref 6–23)
Calcium, Ion: 1.19 mmol/L (ref 1.12–1.23)
Chloride: 104 mEq/L (ref 96–112)
HCT: 31 % — ABNORMAL LOW (ref 39.0–52.0)
Potassium: 3.6 mEq/L (ref 3.5–5.1)
Sodium: 136 mEq/L (ref 135–145)

## 2013-07-17 LAB — CREATININE, SERUM: GFR calc Af Amer: 88 mL/min — ABNORMAL LOW (ref >90)

## 2013-07-17 MED ORDER — FUROSEMIDE 10 MG/ML IJ SOLN
20.0000 mg | Freq: Once | INTRAMUSCULAR | Status: AC
  Administered 2013-07-17: 20 mg via INTRAVENOUS
  Filled 2013-07-17: qty 2

## 2013-07-17 MED ORDER — INSULIN DETEMIR 100 UNIT/ML ~~LOC~~ SOLN
25.0000 [IU] | Freq: Once | SUBCUTANEOUS | Status: AC
  Administered 2013-07-17: 25 [IU] via SUBCUTANEOUS
  Filled 2013-07-17: qty 0.25

## 2013-07-17 MED ORDER — POTASSIUM CHLORIDE 10 MEQ/50ML IV SOLN
INTRAVENOUS | Status: AC
  Filled 2013-07-17: qty 50

## 2013-07-17 MED ORDER — INSULIN ASPART 100 UNIT/ML ~~LOC~~ SOLN
0.0000 [IU] | SUBCUTANEOUS | Status: DC
  Administered 2013-07-17: 2 [IU] via SUBCUTANEOUS

## 2013-07-17 MED ORDER — ENOXAPARIN SODIUM 40 MG/0.4ML ~~LOC~~ SOLN
40.0000 mg | Freq: Every day | SUBCUTANEOUS | Status: DC
  Administered 2013-07-17 – 2013-07-20 (×4): 40 mg via SUBCUTANEOUS
  Filled 2013-07-17 (×5): qty 0.4

## 2013-07-17 MED ORDER — INSULIN DETEMIR 100 UNIT/ML ~~LOC~~ SOLN
25.0000 [IU] | Freq: Every day | SUBCUTANEOUS | Status: DC
  Filled 2013-07-17: qty 0.25

## 2013-07-17 MED ORDER — POTASSIUM CHLORIDE 10 MEQ/50ML IV SOLN
10.0000 meq | INTRAVENOUS | Status: AC
  Administered 2013-07-17 (×4): 10 meq via INTRAVENOUS

## 2013-07-17 NOTE — Progress Notes (Signed)
PM ROUNDS  Feels better this afternoon  Getting up to walk  BP 96/64  Pulse 98  Temp(Src) 98.2 F (36.8 C) (Oral)  Resp 22  Ht 5\' 6"  (1.676 m)  Wt 208 lb 5.4 oz (94.5 kg)  BMI 33.64 kg/m2  SpO2 94%   Intake/Output Summary (Last 24 hours) at 07/17/13 1731 Last data filed at 07/17/13 1600  Gross per 24 hour  Intake 2074.68 ml  Output   3600 ml  Net -1525.32 ml    K 3.6- supplement  Hct 31  CBG well controlled   Doing well POD # 1

## 2013-07-17 NOTE — Op Note (Signed)
NAMEMarland Kitchen  OSWALD, POTT NO.:  [id number]  MEDICAL RECORD NO.:  [id number]  LOCATION:  2S14C                        FACILITY:  [facility]  PHYSICIAN:  Tully Mcinturff. Dorris Fetch, M.D.DATE OF BIRTH:  04-02-1961  DATE OF PROCEDURE:  07/16/2013 DATE OF DISCHARGE:                              OPERATIVE REPORT   PREOPERATIVE DIAGNOSES:  Severe 3-vessel coronary disease with exertional angina.  POSTOPERATIVE DIAGNOSIS:  Severe 3-vessel coronary disease with exertional angina.  PROCEDURE:  Median sternotomy, extracorporeal circulation, coronary artery bypass grafting x4 (left internal mammary artery to LAD, free right internal mammary artery to obtuse marginal, sequential saphenous vein graft to posterior descending and posterolateral), endoscopic vein harvest, right thigh.  SURGEON:  Javin Nong. Dorris Fetch, MD  ASSISTANT:  Rowe Clack, PA-C  ANESTHESIA:  General.  FINDINGS:  Good quality targets, good quality conduits.  CLINICAL NOTE:  Mr. Krotzer is a 52 year old gentleman, who presented with exertional chest tightness.  He had a stress test done which was markedly positive. He then underwent cardiac catheterization, where he was found to have total occlusion of his circumflex and right coronary. There was moderate disease in the LAD, but it was proven to be hemodynamically significant by FFR measurement.  The patient was referred for coronary artery bypass grafting.  The indications, risks, benefits, and alternatives were discussed in detail with the patient. He understood and accepted the risks and agreed to proceed.  OPERATIVE NOTE:  Mr. Kuhrt was brought to the preoperative holding area on July 16, 2013.  There Anesthesia placed a Swan-Ganz catheter and arterial blood pressure monitoring line.  He was taken to the operating room, anesthetized, and intubated.  Intravenous antibiotics were administered.  A Foley catheter was placed.  The chest, abdomen, and  legs were prepped and draped in usual sterile fashion.  An incision was made in the medial aspect of the right leg at the level of the knee.  The greater saphenous vein was harvested endoscopically.  It was a good quality vessel.  Simultaneously with the vein harvest, a median sternotomy was performed.  The left internal mammary artery was harvested using standard technique.  Heparin, 2000 units, was administered.  After harvesting the left mammary, the right mammary artery was harvested using the same technique.  It was a good quality vessel with excellent flow when divided distally.  The remainder of the full heparin dose was given.  The pericardium was opened.  The ascending aorta was inspected.  It was of normal size with no palpable atherosclerotic disease.  After confirming adequate anticoagulation with ACT measurement, the aorta was cannulated via concentric 2-0 Ethibond pledgeted pursestring sutures.  A dual-stage venous cannula was placed via pursestring suture in the right atrial appendage.  Cardiopulmonary bypass was instituted.  The patient was cooled to 32 degrees Celsius.  The coronary arteries were inspected and anastomotic sites were chosen.  Only one potential target vessel in the circumflex distribution was identified and this was the vessel that was seen filling via collaterals.  On the right, there were 2 target vessels that arose in close proximity.  The conduits were inspected and cut to length.  A foam pad was placed in the pericardium  to insulate the heart and protect the left phrenic nerve.  A temperature probe was placed in myocardial septum and a cardioplegia cannula was placed in the ascending aorta.  The aorta was crossclamped.  The left ventricle was emptied via the aortic root vent.  Cardiac arrest then was achieved with a combination of cold antegrade blood cardioplegia and topical iced saline. 750 mL of cardioplegia was administered.  There was rapid  diastolic arrest to myocardial septal cooling to 9 degrees Celsius.  The following distal anastomoses were performed.  First, a reversed saphenous vein graft was placed sequentially to the posterior descending and posterior lateral branches of the right Coronary. The posterior descending was a 2 mm vessel, the posterolateral was a 1.5 mm vessel.  Both were good quality.  A side-to-side anastomosis was performed to the posterior descending and end-to-side to the posterolateral, both were done with running 7-0 Prolene sutures.  At the completion of each anastomosis, it was probed proximally and distally to ensure patency.  Cardioplegia was administered at the completion of the graft. There was good flow and good hemostasis at both anastomoses.  Next, the heart was elevated exposing the obtuse marginal branch of left circumflex.  This was a 1.5 mm vessel.  It bifurcated just beyond the anastomosis.  The 1 mm probe passed easily into both limbs of the bifurcation.  The right mammary was divided proximally and the proximal stump suture ligated.  The distal end of the free right mammary graft then was beveled and anastomosed end-to-side to the OM with a running 8-0 Prolene suture. A probe passed easily proximally and distally at completion of the anastomosis.  After completion of the anastomosis, the heart was placed back in its normal location. Cardioplegia was administered down the vein graft as well as the aortic root.  There was good backbleeding from the mammary graft.  Next, the left internal mammary artery was brought through a window in the pericardium.  The distal end was beveled and was anastomosed end-to- side to the distal LAD.  The LAD was a 1.5 mm target and the mammary was a 1.5 mm conduit. An end-to-side anastomosis was performed with a running 8-0 Prolene suture.  At the completion of the anastomosis, the bulldog clamp was removed and immediate and rapid septal rewarming  was noted.  The bulldog clamp was replaced.  The mammary pedicle was tacked to the epicardial surface of the heart with 6-0 Prolene sutures.  Additional cardioplegia was administered.  The vein graft and right mammary were cut to length.  The proximal anastomoses then were performed to 4.0 mm punch aortotomies using a 7-0 Prolene suture for the right mammary and a 6-0 Prolene suture for the vein.  At completion of the 2nd proximal anastomosis, the patient was placed in Trendelenburg position.  Lidocaine was administered.  The bulldog clamp was removed from the left mammary artery.  Septal rewarming was again noted.  The aortic root was de-aired and the aortic crossclamp was removed.  Total crossclamp time was 77 minutes.  The patient spontaneously resumed sinus Rhythm and did not require defibrillation.  While rewarming was completed, all proximal and distal anastomoses were inspected for hemostasis.  Epicardial pacing wires were placed on the right ventricle and right atrium.  When the patient had rewarmed to a core temperature of 37 degrees Celsius, he was weaned from cardiopulmonary bypass on the first attempt without difficulty.  The total bypass time was 120 minutes.  Initial cardiac index was greater  than 2 liters/minute/m2 and the patient remained hemodynamically stable throughout the postbypass period.  A test dose of protamine was administered and was well tolerated.  The atrial and aortic cannulae were removed.  The remainder of protamine was administered without incident.  The chest was irrigated with warm saline.  Hemostasis was achieved.  The pericardium was reapproximated with interrupted 3-0 silk sutures.  It came together easily without tension or kinking the underlying grafts.  Chest leads were placed in the left and right pleura and mediastinum and secured with #1 silk sutures.  The sternum was closed with a combination of single and double heavy gauge stainless  steel wires.  The pectoralis fascia, subcutaneous tissue, and skin were closed in similar fashion.  All sponge, needle, and instrument counts were correct at the end of procedure and the patient was taken from the operating room to the surgical intensive care unit in good condition.     Salvatore Decent Dorris Fetch, M.D.     SCH/MEDQ  D:  07/16/2013  T:  07/17/2013  Job:  [id number]

## 2013-07-17 NOTE — Progress Notes (Signed)
1 Day Post-Op Procedure(s) (LRB): CORONARY ARTERY BYPASS GRAFTING times using bilateral internal mammary arteries and right leg saphenous vein harvested endoscopically. (N/A) Subjective: Some incisional pain   Objective: Vital signs in last 24 hours: Temp:  [95.4 F (35.2 C)-100.2 F (37.9 C)] 98.6 F (37 C) (10/04 0900) Pulse Rate:  [70-102] 94 (10/04 0900) Cardiac Rhythm:  [-] Normal sinus rhythm (10/04 0800) Resp:  [0-38] 21 (10/04 0900) BP: (79-114)/(46-86) 91/64 mmHg (10/04 0900) SpO2:  [97 %-100 %] 100 % (10/04 0900) Arterial Line BP: (82-120)/(53-79) 99/57 mmHg (10/04 0900) FiO2 (%):  [40 %-50 %] 40 % (10/03 1910) Weight:  [202 lb 13.2 oz (92 kg)-208 lb 5.4 oz (94.5 kg)] 208 lb 5.4 oz (94.5 kg) (10/04 0500)  Hemodynamic parameters for last 24 hours: PAP: (19-36)/(7-25) 23/11 mmHg CO:  [3.2 L/min-6.2 L/min] 5.8 L/min CI:  [1.6 L/min/m2-3.1 L/min/m2] 2.9 L/min/m2  Intake/Output from previous day: 10/03 0701 - 10/04 0700 In: 6820.3 [I.V.:5145.3; Blood:445; NG/GT:30; IV Piggyback:1200] Out: 7373 [Urine:6810; Emesis/NG output:100; Chest Tube:463] Intake/Output this shift: Total I/O In: 396.9 [I.V.:146.9; IV Piggyback:250] Out: 125 [Urine:75; Chest Tube:50]  General appearance: alert and no distress Neurologic: intact Heart: regular rate and rhythm Lungs: diminished breath sounds bibasilar Abdomen: normal findings: soft, non-tender  Lab Results:  Recent Labs  07/16/13 1900 07/16/13 1949 07/17/13 0412  WBC 13.4*  --  13.6*  HGB 13.1 12.9* 12.0*  HCT 36.9* 38.0* 33.8*  PLT 173  --  177   BMET:  Recent Labs  07/14/13 1351  07/16/13 1949 07/17/13 0412  NA 139  < > 142 141  K 4.1  < > 4.4 4.0  CL 104  < > 108 107  CO2 22  --   --  22  GLUCOSE 90  < > 107* 125*  BUN 20  < > 17 14  CREATININE 1.18  < > 1.10 1.09  CALCIUM 9.5  --   --  7.8*  < > = values in this interval not displayed.  PT/INR:  Recent Labs  07/16/13 1352  LABPROT 18.4*  INR 1.58*    ABG    Component Value Date/Time   PHART 7.312* 07/16/2013 2039   HCO3 21.6 07/16/2013 2039   TCO2 23 07/16/2013 2039   ACIDBASEDEF 4.0* 07/16/2013 2039   O2SAT 97.0 07/16/2013 2039   CBG (last 3)   Recent Labs  07/16/13 1948 07/17/13 0031 07/17/13 0400  GLUCAP 111* 113* 123*    Assessment/Plan: S/P Procedure(s) (LRB): CORONARY ARTERY BYPASS GRAFTING times using bilateral internal mammary arteries and right leg saphenous vein harvested endoscopically. (N/A) POD # 1 CABG x 4, BIMA CV- BP a little low, wean neo as tolerated  good CO- dc swan, a line  RESP- IS for bibasilar atelectasis  RENAL- lytes, creatinine OK, diurese as BP allows  Anemia secondary to ABL- mild, follow  Enoxaparin and SCD for DVT prophylaxis  Dc CT  OOB, ambulate   LOS: 1 day    [name],[name] 07/17/2013

## 2013-07-18 ENCOUNTER — Inpatient Hospital Stay (HOSPITAL_COMMUNITY): Payer: 59

## 2013-07-18 LAB — CBC
Hemoglobin: 10.9 g/dL — ABNORMAL LOW (ref 13.0–17.0)
MCV: 84.8 fL (ref 78.0–100.0)
Platelets: 121 10*3/uL — ABNORMAL LOW (ref 150–400)
RBC: 3.68 MIL/uL — ABNORMAL LOW (ref 4.22–5.81)
RDW: 13.2 % (ref 11.5–15.5)
WBC: 11.1 10*3/uL — ABNORMAL HIGH (ref 4.0–10.5)

## 2013-07-18 LAB — GLUCOSE, CAPILLARY
Glucose-Capillary: 98 mg/dL (ref 70–99)
Glucose-Capillary: 101 mg/dL — ABNORMAL HIGH (ref 70–99)

## 2013-07-18 LAB — BASIC METABOLIC PANEL
CO2: 25 mEq/L (ref 19–32)
Calcium: 8.1 mg/dL — ABNORMAL LOW (ref 8.4–10.5)
Creatinine, Ser: 0.99 mg/dL (ref 0.50–1.35)
GFR calc Af Amer: >90 mL/min (ref >90)
GFR calc non Af Amer: >90 mL/min (ref >90)
Sodium: 135 mEq/L (ref 135–145)

## 2013-07-18 MED ORDER — MAGNESIUM HYDROXIDE 400 MG/5ML PO SUSP: 30.0000 mL | Freq: Every day | ORAL | Status: DC | PRN

## 2013-07-18 MED ORDER — NAPROXEN SODIUM 275 MG PO TABS
275.0000 mg | ORAL_TABLET | Freq: Two times a day (BID) | ORAL | Status: DC | PRN
  Filled 2013-07-18: qty 1

## 2013-07-18 MED ORDER — NAPROXEN SODIUM 275 MG PO TABS: 250.0000 mg | ORAL_TABLET | Freq: Two times a day (BID) | ORAL | Status: DC | PRN

## 2013-07-18 MED ORDER — ALUM & MAG HYDROXIDE-SIMETH 200-200-20 MG/5ML PO SUSP: 15.0000 mL | ORAL | Status: DC | PRN

## 2013-07-18 MED ORDER — SODIUM CHLORIDE 0.9 % IV SOLN: 250.0000 mL | INTRAVENOUS | Status: DC | PRN

## 2013-07-18 MED ORDER — ALPRAZOLAM 0.25 MG PO TABS: 0.2500 mg | ORAL_TABLET | Freq: Four times a day (QID) | ORAL | Status: DC | PRN

## 2013-07-18 MED ORDER — NAPROXEN 250 MG PO TABS
250.0000 mg | ORAL_TABLET | Freq: Two times a day (BID) | ORAL | Status: DC | PRN
  Filled 2013-07-18: qty 1

## 2013-07-18 MED ORDER — GUAIFENESIN-DM 100-10 MG/5ML PO SYRP: 15.0000 mL | ORAL_SOLUTION | ORAL | Status: DC | PRN

## 2013-07-18 MED ORDER — MOVING RIGHT ALONG BOOK
Freq: Once | Status: AC
  Administered 2013-07-18: 17:00:00
  Filled 2013-07-18: qty 1

## 2013-07-18 MED ORDER — SODIUM CHLORIDE 0.9 % IJ SOLN: 3.0000 mL | INTRAMUSCULAR | Status: DC | PRN

## 2013-07-18 MED ORDER — ZOLPIDEM TARTRATE 5 MG PO TABS: 5.0000 mg | ORAL_TABLET | Freq: Every evening | ORAL | Status: DC | PRN

## 2013-07-18 MED ORDER — METOPROLOL SUCCINATE ER 25 MG PO TB24
25.0000 mg | ORAL_TABLET | Freq: Every day | ORAL | Status: DC
  Administered 2013-07-18 – 2013-07-19 (×2): 25 mg via ORAL
  Filled 2013-07-18 (×2): qty 1

## 2013-07-18 MED ORDER — LORATADINE 10 MG PO TABS
10.0000 mg | ORAL_TABLET | Freq: Every day | ORAL | Status: DC
  Administered 2013-07-18 – 2013-07-20 (×3): 10 mg via ORAL
  Filled 2013-07-18 (×4): qty 1

## 2013-07-18 MED ORDER — SODIUM CHLORIDE 0.9 % IJ SOLN
3.0000 mL | Freq: Two times a day (BID) | INTRAMUSCULAR | Status: DC
  Administered 2013-07-18 – 2013-07-20 (×5): 3 mL via INTRAVENOUS

## 2013-07-18 MED ORDER — INFLUENZA VAC SPLIT QUAD 0.5 ML IM SUSP
0.5000 mL | INTRAMUSCULAR | Status: AC
  Filled 2013-07-18: qty 0.5

## 2013-07-18 NOTE — Progress Notes (Signed)
2 Days Post-Op Procedure(s) (LRB): CORONARY ARTERY BYPASS GRAFTING times using bilateral internal mammary arteries and right leg saphenous vein harvested endoscopically. (N/A) Subjective: Feels better Still sore, appetite poor  Objective: Vital signs in last 24 hours: Temp:  [97.9 F (36.6 C)-98.7 F (37.1 C)] 98.2 F (36.8 C) (10/05 0400) Pulse Rate:  [95-116] 106 (10/05 0900) Cardiac Rhythm:  [-] Normal sinus rhythm;Sinus tachycardia (10/05 0800) Resp:  [11-29] 16 (10/05 0900) BP: (87-121)/(56-77) 109/72 mmHg (10/05 0900) SpO2:  [90 %-100 %] 100 % (10/05 0900) Arterial Line BP: (92-129)/(50-63) 107/52 mmHg (10/04 1700) Weight:  [209 lb 10.5 oz (95.1 kg)] 209 lb 10.5 oz (95.1 kg) (10/05 0600)  Hemodynamic parameters for last 24 hours: PAP: (26)/(13-14) 26/13 mmHg  Intake/Output from previous day: 10/04 0701 - 10/05 0700 In: 1196.1 [I.V.:796.1; IV Piggyback:400] Out: 2135 [Urine:2025; Chest Tube:110] Intake/Output this shift: Total I/O In: 40 [I.V.:40] Out: -   General appearance: alert and no distress Neurologic: intact Heart: regular rate and rhythm Lungs: diminished breath sounds bibasilar Abdomen: normal findings: soft, non-tender  Lab Results:  Recent Labs  07/17/13 1700 07/17/13 1713 07/18/13 0340  WBC 11.1*  --  11.1*  HGB 10.9* 10.5* 10.9*  HCT 31.2* 31.0* 31.2*  PLT 120*  --  121*   BMET:  Recent Labs  07/17/13 0412  07/17/13 1713 07/18/13 0340  NA 141  --  136 135  K 4.0  --  3.6 4.0  CL 107  --  104 102  CO2 22  --   --  25  GLUCOSE 125*  --  136* 102*  BUN 14  --  18 16  CREATININE 1.09  < > 1.20 0.99  CALCIUM 7.8*  --   --  8.1*  < > = values in this interval not displayed.  PT/INR:  Recent Labs  07/16/13 1352  LABPROT 18.4*  INR 1.58*   ABG    Component Value Date/Time   PHART 7.312* 07/16/2013 2039   HCO3 21.6 07/16/2013 2039   TCO2 22 07/17/2013 1713   ACIDBASEDEF 4.0* 07/16/2013 2039   O2SAT 97.0 07/16/2013 2039   CBG (last  3)   Recent Labs  07/18/13 0017 07/18/13 0406 07/18/13 0812  GLUCAP 98 101* 101*    Assessment/Plan: S/P Procedure(s) (LRB): CORONARY ARTERY BYPASS GRAFTING times using bilateral internal mammary arteries and right leg saphenous vein harvested endoscopically. (N/A) Plan for transfer to step-down: see transfer orders  POD # 2 CABG  CV- stable- ASA, beta blocker  RESP- pulmonary hygiene  RENAL- lytes, creatinine OK  Weight still up but no urgent need for diuresis  ENDO- CBG normal- dc CBG/ SSI  Cardiac rehab   LOS: 2 days    [name],[name] 07/18/2013

## 2013-07-18 NOTE — Progress Notes (Signed)
Pt ambulated 150 feet with walker on RA. Patient tolerated well. Will continue to monitor. Fraser Din, RN

## 2013-07-18 NOTE — Progress Notes (Signed)
1330-Report called to [facility], 2W RN.  1400-Pt to transfer to 2W-19 via ambulation, 2L O2. VS stable at time of transfer, belongings and family at bedside, meds in chart. Pt c/o 3/10 incisional pain after ambulation to 2W. 2W RN aware. Sleeve removed after arrival to room. No questions or complaints at this time.

## 2013-07-18 NOTE — Addendum Note (Signed)
Addendum created 07/18/13 1543 by Elisabeth Most, CRNA   Modules edited: Anesthesia LDA

## 2013-07-19 ENCOUNTER — Inpatient Hospital Stay (HOSPITAL_COMMUNITY): Payer: 59

## 2013-07-19 DIAGNOSIS — I498 Other specified cardiac arrhythmias: Secondary | ICD-10-CM

## 2013-07-19 LAB — BASIC METABOLIC PANEL
Chloride: 104 mEq/L (ref 96–112)
GFR calc Af Amer: >90 mL/min (ref >90)
GFR calc non Af Amer: >90 mL/min (ref >90)
Potassium: 3.8 mEq/L (ref 3.5–5.1)
Sodium: 141 mEq/L (ref 135–145)

## 2013-07-19 LAB — CBC
HCT: 31.7 % — ABNORMAL LOW (ref 39.0–52.0)
MCHC: 35 g/dL (ref 30.0–36.0)
Platelets: 158 10*3/uL (ref 150–400)
RBC: 3.76 MIL/uL — ABNORMAL LOW (ref 4.22–5.81)
RDW: 12.9 % (ref 11.5–15.5)
WBC: 9.1 10*3/uL (ref 4.0–10.5)

## 2013-07-19 MED ORDER — METOPROLOL TARTRATE 25 MG PO TABS
25.0000 mg | ORAL_TABLET | Freq: Once | ORAL | Status: AC
  Administered 2013-07-19: 25 mg via ORAL
  Filled 2013-07-19: qty 1

## 2013-07-19 MED ORDER — METOPROLOL SUCCINATE ER 50 MG PO TB24
50.0000 mg | ORAL_TABLET | Freq: Every day | ORAL | Status: DC
  Administered 2013-07-20: 50 mg via ORAL
  Filled 2013-07-19 (×2): qty 1

## 2013-07-19 MED ORDER — POLYETHYLENE GLYCOL 3350 17 G PO PACK
17.0000 g | PACK | Freq: Once | ORAL | Status: AC
  Administered 2013-07-19: 17 g via ORAL
  Filled 2013-07-19: qty 1

## 2013-07-19 NOTE — Progress Notes (Signed)
CARDIAC REHAB PHASE I   PRE:  Rate/Rhythm: 111ST  BP:  Supine: 108/70  Sitting:   Standing:    SaO2: 94%2L  MODE:  Ambulation: 550 ft   POST:  Rate/Rhythm: 125 ST  BP:  Supine:   Sitting: 120/66  Standing:    SaO2: 96%RA hall and in room [phone] Pt walked 550 ft on RA with rolling walker and steady gait. Tolerated well on RA. Left off of oxygen after walk. To recliner with call bell. Encouraged 2 more walks today.   Luetta Nutting, RN BSN  07/19/2013 9:42 AM

## 2013-07-19 NOTE — Progress Notes (Addendum)
      [address]       [place] [address]             [phone]      3 Days Post-Op Procedure(s) (LRB): CORONARY ARTERY BYPASS GRAFTING times using bilateral internal mammary arteries and right leg saphenous vein harvested endoscopically. (N/A)  Subjective:  Mr. Dermody has no complaints this morning.  He denies chest pain and dyspnea, but states he isn't breathing as well as he normally would. No BM + Flatus  Objective: Vital signs in last 24 hours: Temp:  [98.3 F (36.8 C)-99.3 F (37.4 C)] 98.6 F (37 C) (10/06 2130) Pulse Rate:  [64-116] 110 (10/06 8657) Cardiac Rhythm:  [-] Sinus tachycardia (10/05 2045) Resp:  [16-30] 24 (10/06 0608) BP: (97-125)/(67-81) 117/80 mmHg (10/06 0608) SpO2:  [93 %-100 %] 98 % (10/06 8469) Weight:  [204 lb (92.534 kg)] 204 lb (92.534 kg) (10/06 6295)  Intake/Output from previous day: 10/05 0701 - 10/06 0700 In: 540 [P.O.:480; I.V.:60] Out: 1000 [Urine:1000]  General appearance: alert, cooperative and no distress Heart: regular rate and rhythm Lungs: diminished breath sounds bibasilar Abdomen: soft, non-tender; bowel sounds normal; no masses,  no organomegaly Extremities: edema trace Wound: clean and dry  Lab Results:  Recent Labs  07/18/13 0340 07/19/13 0525  WBC 11.1* 9.1  HGB 10.9* 11.1*  HCT 31.2* 31.7*  PLT 121* 158   BMET:  Recent Labs  07/18/13 0340 07/19/13 0525  NA 135 141  K 4.0 3.8  CL 102 104  CO2 25 27  GLUCOSE 102* 97  BUN 16 14  CREATININE 0.99 0.94  CALCIUM 8.1* 8.3*    PT/INR:  Recent Labs  07/16/13 1352  LABPROT 18.4*  INR 1.58*   ABG    Component Value Date/Time   PHART 7.312* 07/16/2013 2039   HCO3 21.6 07/16/2013 2039   TCO2 22 07/17/2013 1713   ACIDBASEDEF 4.0* 07/16/2013 2039   O2SAT 97.0 07/16/2013 2039   CBG (last 3)   Recent Labs  07/18/13 0017 07/18/13 0406 07/18/13 0812  GLUCAP 98 101* 101*    Assessment/Plan: S/P Procedure(s) (LRB): CORONARY ARTERY  BYPASS GRAFTING times using bilateral internal mammary arteries and right leg saphenous vein harvested endoscopically. (N/A)  1. CV- NSR tachy, SBP in the 120s- on Toprol XL at 25 mg daily, may benefit from BID dosing or increase to 50 mg XL dose 2. Pulm- wean oxygen as tolerated, encouraged use of IS + effusion/atelectasis on CXR 3. Renal- creatinine WNL, lytes okay- volume status is stable 4. Dispo- patient stable, will d/c EPW, may benefit from increase in Beta Blocker   LOS: 3 days    Lowella Dandy 07/19/2013  Looks great Will give 25 of lopressor this PM and increase toprol to 50 daily tomorrow No BM yet- miralax

## 2013-07-19 NOTE — Progress Notes (Signed)
Mild sinus tachycardia (120) sitting on side of bed Progressing well Agree with increasing beta blocker

## 2013-07-19 NOTE — Progress Notes (Signed)
07/19/2013 1145 Juan [name] PAC able to remove EPW. Vital signs collected per protocol. Pt. Advised of bedrest for one hour post removal. Call bell within reach. Will continue to monitor patient.  [name], [name]

## 2013-07-19 NOTE — Progress Notes (Signed)
07/19/2013 1110 Nursing note RN attempted to d/c EPW per orders and per protocol. Resistance met. Second RN evaluated EPW, resistance still felt. Denny Peon Barrett PAC paged and made aware. PA to see pt. EPW rolled and taped to pt. Chest until evaluated by PA. Will continue to closely monitor patient.  Breccan Galant, [name]

## 2013-07-19 NOTE — Progress Notes (Signed)
07/19/2013 6:11 PM Nursing note Pt. Ambulated 300 ft with SN, standby assist and on room air. Pt. Tolerated well. Encouraged one more walk this evening.  Tiphani Mells, [name]

## 2013-07-19 NOTE — Care Management Note (Signed)
    Page 1 of 1   07/19/2013     2:35:21 PM   CARE MANAGEMENT NOTE 07/19/2013  Patient:  Juan [name], Juan [name]   Account Number:  [id number]  Date Initiated:  07/16/2013  Documentation initiated by:  [facility]  Subjective/Objective Assessment:   post op CABG x4.     Action/Plan:   Anticipated DC Date:  07/21/2013   Anticipated DC Plan:  HOME W HOME HEALTH SERVICES      DC Planning Services  CM consult      Choice offered to / List presented to:             Status of service:  In process, will continue to follow Medicare Important Message given?   (If response is "NO", the following Medicare IM given date fields will be blank) Date Medicare IM given:   Date Additional Medicare IM given:    Discharge Disposition:    Per UR Regulation:  Reviewed for med. necessity/level of care/duration of stay  If discussed at Long Length of Stay Meetings, dates discussed:    Comments:  ContactLevorn, [place] [phone]   [phone]  07-19-13 2:30pm Avie Arenas, RNBSN [phone] Post op CABG - progressing - plan for discharge home with wife - no needs anticipated.

## 2013-07-20 ENCOUNTER — Encounter (HOSPITAL_COMMUNITY): Payer: Self-pay | Admitting: Thoracic Surgery (Cardiothoracic Vascular Surgery)

## 2013-07-20 DIAGNOSIS — Z951 Presence of aortocoronary bypass graft: Secondary | ICD-10-CM

## 2013-07-20 LAB — URINALYSIS, ROUTINE W REFLEX MICROSCOPIC
Bilirubin Urine: NEGATIVE
Leukocytes, UA: NEGATIVE
Nitrite: NEGATIVE
Protein, ur: NEGATIVE mg/dL
Specific Gravity, Urine: 1.023 (ref 1.005–1.030)
Urobilinogen, UA: 1 mg/dL (ref 0.0–1.0)
pH: 6.5 (ref 5.0–8.0)

## 2013-07-20 MED ORDER — LACTULOSE 10 GM/15ML PO SOLN
20.0000 g | Freq: Every day | ORAL | Status: DC
  Administered 2013-07-20: 20 g via ORAL
  Filled 2013-07-20 (×2): qty 30

## 2013-07-20 MED ORDER — FLEET ENEMA 7-19 GM/118ML RE ENEM
1.0000 | ENEMA | Freq: Once | RECTAL | Status: DC
  Filled 2013-07-20: qty 1

## 2013-07-20 MED FILL — Heparin Sodium (Porcine) Inj 1000 Unit/ML: INTRAMUSCULAR | Qty: 30 | Status: AC

## 2013-07-20 MED FILL — Magnesium Sulfate Inj 50%: INTRAMUSCULAR | Qty: 10 | Status: AC

## 2013-07-20 MED FILL — Sodium Chloride IV Soln 0.9%: INTRAVENOUS | Qty: 1000 | Status: AC

## 2013-07-20 MED FILL — Potassium Chloride Inj 2 mEq/ML: INTRAVENOUS | Qty: 40 | Status: AC

## 2013-07-20 NOTE — Progress Notes (Addendum)
      [address]       [place] [address]             [phone]      4 Days Post-Op Procedure(s) (LRB): CORONARY ARTERY BYPASS GRAFTING times using bilateral internal mammary arteries and right leg saphenous vein harvested endoscopically. (N/A)  Subjective:  Juan [name] is feeling better this morning.  He was successfully weaned off oxygen yesterday.  He is ambulating.  No BM  Objective: Vital signs in last 24 hours: [name]:  [97.3 F (36.3 C)-100.2 F (37.9 C)] 100.2 F (37.9 C) (10/07 0558) Pulse Rate:  [105-127] 118 (10/07 0558) Cardiac Rhythm:  [-] Sinus tachycardia (10/06 2030) Resp:  [18] 18 (10/07 0558) BP: (98-121)/(62-77) 121/74 mmHg (10/07 0558) SpO2:  [96 %-100 %] 97 % (10/07 0558) Weight:  [201 lb 4.5 oz (91.3 kg)] 201 lb 4.5 oz (91.3 kg) (10/07 0558)  Intake/Output from previous day: 10/06 0701 - 10/07 0700 In: 840 [P.O.:840] Out: 1150 [Urine:1150]  General appearance: alert, cooperative and no distress Heart: regular rate and rhythm Lungs: diminished breath sounds bibasilar Abdomen: soft, non-tender; bowel sounds normal; no masses,  no organomegaly Extremities: edema trace Wound: clean and dry  Lab Results:  Recent Labs  07/18/13 0340 07/19/13 0525  WBC 11.1* 9.1  HGB 10.9* 11.1*  HCT 31.2* 31.7*  PLT 121* 158   BMET:  Recent Labs  07/18/13 0340 07/19/13 0525  NA 135 141  K 4.0 3.8  CL 102 104  CO2 25 27  GLUCOSE 102* 97  BUN 16 14  CREATININE 0.99 0.94  CALCIUM 8.1* 8.3*    PT/INR: No results found for this basename: LABPROT, INR,  in the last 72 hours ABG    Component Value Date/Time   PHART 7.312* 07/16/2013 2039   HCO3 21.6 07/16/2013 2039   TCO2 22 07/17/2013 1713   ACIDBASEDEF 4.0* 07/16/2013 2039   O2SAT 97.0 07/16/2013 2039   CBG (last 3)   Recent Labs  07/18/13 0017 07/18/13 0406 07/18/13 0812  GLUCAP 98 101* 101*    Assessment/Plan: S/P Procedure(s) (LRB): CORONARY ARTERY BYPASS GRAFTING times  using bilateral internal mammary arteries and right leg saphenous vein harvested endoscopically. (N/A)  1. CV- NSR, remains tachy, SBP remains in the 120s- Toprol increased to 50 mg for this morning's dose- if no improvement may benefit from twice daily dosing 2. Pulm- off oxygen, continued atelectasis, good use of IS, encouraged continued use 3. Renal- weight is below admission, + pleural effusions on CXR, will continue oral Lasix for now 4. ID- Febrile this morning, no leukocytosis, likely due to atelectasis, will follow 5. LOC constipation- Miralax started yesterday, will order Lactulose prn 6. Dispo- patient stable, EPW have been removed, will monitor today, if afebrile tomorrow, will plan to d/c   LOS: 4 days    BARRETT, ERIN 07/20/2013  Patient seen and examined, agree with above Juan [name]- probably atelectasis, but will check UA. IV sites and incisions OK

## 2013-07-20 NOTE — Progress Notes (Signed)
Pt ambulated in hallway 300 ft with rolling walker and tolerated activity well. Will continue to monitor.  

## 2013-07-20 NOTE — Discharge Summary (Signed)
Physician Discharge Summary  Patient ID: Juan [name] MRN: [id number] DOB/AGE: May 06, 1961 52 y.o.  Admit date: 07/16/2013 Discharge date: 07/20/2013  Admission Diagnoses:  Patient Active Problem List   Diagnosis Date Noted  . CAD (coronary artery disease)   . Pure hypercholesterolemia   . Other nonspecific abnormal cardiovascular system function study 07/09/2013  . Coronary atherosclerosis of native coronary artery 07/09/2013  . Exertional angina 07/09/2013  . Hyperlipidemia 07/09/2013  . Essential hypertension 07/09/2013  . Obesity, unspecified 07/09/2013   Discharge Diagnoses:   Patient Active Problem List   Diagnosis Date Noted  . S/P CABG x 4 07/20/2013  . CAD (coronary artery disease)   . Pure hypercholesterolemia   . Other nonspecific abnormal cardiovascular system function study 07/09/2013  . Coronary atherosclerosis of native coronary artery 07/09/2013  . Exertional angina 07/09/2013  . Hyperlipidemia 07/09/2013  . Essential hypertension 07/09/2013  . Obesity, unspecified 07/09/2013   Discharged Condition: good  History of Present Illness:   Mr. Thoman is a 52 yo white male with known history of hypercholesterolemia.  He was in his normal state of health until 1 month ago.  At that time the patient developed pain in back between his shoulder blades and chest tightness with exertion.  This most often occurred with ambulation up stairs or an incline.  He presented to his PCP who recommended stress test be performed.  During this procedure the patient developed chest pain after 3 minutes of exercise.  EKG changes showed diffuse ST segment depression.  It was felt he would benefit from further cardiac workup.  The patient was evaluated by Dr. Anne Fu who felt cardiac catheterization was warranted.  This was performed on 07/09/2013 and revealed multi-vessel CAD and a mildly reduce EF of 50%.  It was felt his best treatment option would be Coronary Bypass procedure.  Therefore  he was referred to [facility] for possible surgical intervention.  The patient was evaluated by Dr. Dorris Fetch on 07/13/2013 at which time he was in agreement the patient would benefit from Coronary Bypass procedure.  The risks and benefits of the procedure were explained to the patient and he was agreeable to proceed.   Hospital Course:   The patient presented to [facility] on 07/16/2013.  He was taken to the operating room and underwent CABG x 4 utilizing LIMA to LAD, Free RIMA to OM, and a Sequential SVG to PD and PLVB.  He also underwent Endoscopic saphenous vein harvest from his right thigh.  He tolerated the procedure well and was transferred to the SICU in stable condition.  He was extubated the evening of surgery without difficulty.  During his stay in the ICU the patient was weaned off all Inotrope as tolerated.  His chest tubes and arterial lines were removed without difficulty. The patient was ambulating without difficulty and was transferred to the step down unit in stable condition.  The patient has continued to progress.  He has had some issues with Tachycardia and his Toprol XL has been titrated as tolerated.  He is maintaining NSR and his pacing wires were removed without difficulty.  The patient remains mildly volume overloaded with atelectasis and bilateral pleural effusions on chest xray.  He has been successfully weaned off oxygen with good saturations. He will be discharged home on a short course of diuretic.  He continues to ambulate without difficulty.  He is tolerating a cardiac diet.  Should no further issues arise we anticipate discharge home in the next 24-48 hours.  He will follow up with Dr. Dorris Fetch on 08/10/2013 with a chest xray prior to his appointment.  He will also need to schedule a follow up with Dr. Anne Fu in 2-4 weeks time.   Significant Diagnostic Studies: angiography:   HEMODYNAMICS: Aortic pressure was 97/110mmHg; LV systolic pressure was ; LVEDP .  There was no gradient between the left ventricle and aorta.   ANGIOGRAPHIC DATA:  Left main: No angiogram to the significant coronary artery disease.  Left anterior descending (LAD): There is moderate disease after the first diagonal branch of approximately 50%. Questionable flow limitation.  Circumflex artery (CIRC): Proximal circumflex is occluded. There are left to left collaterals filling the remainder of the obtuse marginals. Complex lesion.  Right coronary artery (RCA): The distal RCA is occluded just before the bifurcation of the PDA and PL branch. There is slow sluggish flow. Right to right as well as left to right collaterals noted.   LEFT VENTRICULOGRAM: Left ventricular angiogram was done in the 30 RAO projection and revealed base to mid inferior wall akinesis with an estimated ejection fraction of 50%.   Treatments: surgery:   Median sternotomy, extracorporeal circulation, coronary artery bypass grafting x4 (left internal mammary artery to LAD, free right internal mammary artery to obtuse marginal, sequential saphenous vein graft to posterior descending and posterolateral), endoscopic vein harvest, right thigh.  Disposition: 01-Home or Self Care  Discharge Medications:     Medication List    STOP taking these medications       metoprolol succinate 25 MG 24 hr tablet  Commonly known as:  TOPROL-XL     mupirocin cream 2 %  Commonly known as:  BACTROBAN     nitroGLYCERIN 0.4 MG SL tablet  Commonly known as:  NITROSTAT      TAKE these medications       ALLEGRA PO  Take 1 tablet by mouth daily as needed (allergies).     aspirin 325 MG EC tablet  Take 1 tablet (325 mg total) by mouth daily.     atorvastatin 40 MG tablet  Commonly known as:  LIPITOR  Take 40 mg by mouth daily.     furosemide 40 MG tablet  Commonly known as:  LASIX  Take 1 tablet (40 mg total) by mouth daily. For 5 Days     metoprolol 50 MG tablet  Commonly known as:  LOPRESSOR  Take 1 tablet  (50 mg total) by mouth 2 (two) times daily.     naproxen sodium 220 MG tablet  Commonly known as:  ANAPROX  Take 220 mg by mouth daily as needed (for pain).     oxyCODONE 5 MG immediate release tablet  Commonly known as:  Oxy IR/ROXICODONE  Take 1-2 tablets (5-10 mg total) by mouth every 4 (four) hours as needed.     potassium chloride 10 MEQ tablet  Commonly known as:  K-DUR  Take 2 tablets (20 mEq total) by mouth daily.         The patient has been discharged on:   1.Beta Blocker:  Yes [ x  ]                              No   [   ]                              If No, reason:  2.Ace Inhibitor/ARB:  Yes [   ]                                     No  [ x   ]                                     If No, reason: Labile blood pressure  3.Statin:   Yes [ x  ]                  No  [   ]                  If No, reason:  4.Ecasa:  Yes  [ x  ]                  No   [   ]                  If No, reason:      Future Appointments Provider Department Dept Phone   08/10/2013 11:30 AM Loreli Slot, MD [facility] [phone]         Follow-up Information   Follow up with Loreli Slot, MD On 08/10/2013. (Appointment is at 11:30)    Specialty:  Cardiothoracic Surgery   Contact information:   [address] [place] [address] [place] [address] [phone]       Follow up with [facility] On 08/10/2013. (Please get CXR at 10:30)    Contact information:   [place]       Schedule an appointment as soon as possible for a visit with Donato Schultz, MD. (Please contact office to set up 2-4 week follow up)    Specialty:  Cardiology   Contact information:   [address] [place] [address] [phone]       Signed: Lowella Dandy 07/20/2013, 9:18 AM

## 2013-07-20 NOTE — Progress Notes (Signed)
Tachy 120's Metoprolol increased yesterday to 50mg   Consider 100mg  of Toprol XL.  No significant anemia, O2 demand improved, Xray improved effusions No fever Seems to be elevated beyond what is expected Clinically stable however  If continues, ?ECHO  Will continue to follow.

## 2013-07-20 NOTE — Progress Notes (Signed)
CARDIAC REHAB PHASE I   PRE:  Rate/Rhythm:  Up in hall  BP:  Supine:   Sitting:   Standing:    SaO2: 96%RA  MODE:  Ambulation: 900 ft   POST:  Rate/Rhythm: 130  BP:  Supine:   Sitting: 124/82  Standing:    SaO2: 97%RA 1010-1025 Pt up in hall independently with rolling walker with steady gait. Does not need walker for home. Tolerated increase in distance well. Knows  to walk 2 more times today and use IS. Walked 900 ft on RA.   Luetta Nutting, RN BSN  07/20/2013 11:16 AM

## 2013-07-21 ENCOUNTER — Inpatient Hospital Stay (HOSPITAL_COMMUNITY): Payer: 59

## 2013-07-21 LAB — BASIC METABOLIC PANEL
BUN: 14 mg/dL (ref 6–23)
CO2: 25 mEq/L (ref 19–32)
Calcium: 8.5 mg/dL (ref 8.4–10.5)
Chloride: 106 mEq/L (ref 96–112)
Creatinine, Ser: 1 mg/dL (ref 0.50–1.35)
GFR calc Af Amer: >90 mL/min (ref >90)
Glucose, Bld: 98 mg/dL (ref 70–99)
Sodium: 141 mEq/L (ref 135–145)

## 2013-07-21 LAB — CBC
Hemoglobin: 10.9 g/dL — ABNORMAL LOW (ref 13.0–17.0)
MCV: 83.9 fL (ref 78.0–100.0)
Platelets: 229 10*3/uL (ref 150–400)
RBC: 3.72 MIL/uL — ABNORMAL LOW (ref 4.22–5.81)
RDW: 12.9 % (ref 11.5–15.5)
WBC: 8.1 10*3/uL (ref 4.0–10.5)

## 2013-07-21 MED ORDER — FUROSEMIDE 40 MG PO TABS: 40.0000 mg | ORAL_TABLET | Freq: Every day | ORAL | Status: DC

## 2013-07-21 MED ORDER — POTASSIUM CHLORIDE ER 10 MEQ PO TBCR: 20.0000 meq | EXTENDED_RELEASE_TABLET | Freq: Every day | ORAL | Status: DC

## 2013-07-21 MED ORDER — METOPROLOL TARTRATE 50 MG PO TABS: 50.0000 mg | ORAL_TABLET | Freq: Two times a day (BID) | ORAL | Status: DC

## 2013-07-21 MED ORDER — OXYCODONE HCL 5 MG PO TABS: 5.0000 mg | ORAL_TABLET | ORAL | Status: DC | PRN

## 2013-07-21 MED ORDER — ASPIRIN 325 MG PO TBEC: 325.0000 mg | DELAYED_RELEASE_TABLET | Freq: Every day | ORAL | Status: DC

## 2013-07-21 MED FILL — Heparin Sodium (Porcine) Inj 1000 Unit/ML: INTRAMUSCULAR | Qty: 10 | Status: AC

## 2013-07-21 MED FILL — Sodium Chloride Irrigation Soln 0.9%: Qty: 3000 | Status: AC

## 2013-07-21 MED FILL — Electrolyte-R (PH 7.4) Solution: INTRAVENOUS | Qty: 5000 | Status: AC

## 2013-07-21 MED FILL — Lidocaine HCl IV Inj 20 MG/ML: INTRAVENOUS | Qty: 5 | Status: AC

## 2013-07-21 MED FILL — Mannitol IV Soln 20%: INTRAVENOUS | Qty: 500 | Status: AC

## 2013-07-21 MED FILL — Sodium Bicarbonate IV Soln 8.4%: INTRAVENOUS | Qty: 50 | Status: AC

## 2013-07-21 NOTE — Progress Notes (Signed)
CARDIAC REHAB PHASE I   Ed completed. Voiced understanding and requests his name be sent to [facility].  [phone]  Elissa [place] CES, ACSM 07/21/2013 9:19 AM

## 2013-07-21 NOTE — Progress Notes (Addendum)
      [address]       [place] [address]             [phone]      5 Days Post-Op Procedure(s) (LRB): CORONARY ARTERY BYPASS GRAFTING times using bilateral internal mammary arteries and right leg saphenous vein harvested endoscopically. (N/A)  Subjective:  Juan [name] has no complaints this morning. + Ambulation + BM  Objective: Vital signs in last 24 hours: Temp:  [98.5 F (36.9 C)-99.9 F (37.7 C)] 98.5 F (36.9 C) (10/08 0552) Pulse Rate:  [107-114] 107 (10/08 0552) Cardiac Rhythm:  [-] Sinus tachycardia (10/07 2100) Resp:  [18-20] 20 (10/08 0552) BP: (102-113)/(61-72) 102/62 mmHg (10/08 0552) SpO2:  [94 %-98 %] 96 % (10/08 0552) Weight:  [201 lb 9.6 oz (91.445 kg)] 201 lb 9.6 oz (91.445 kg) (10/08 0552)  Intake/Output from previous day: 10/07 0701 - 10/08 0700 In: 1440 [P.O.:1440] Out: 1200 [Urine:1200]  General appearance: alert, cooperative and no distress Neurologic: intact Heart: regular rate and rhythm Lungs: diminished breath sounds bibasilar Abdomen: soft, non-tender; bowel sounds normal; no masses,  no organomegaly Extremities: edema trace Wound: clean and dry  Lab Results:  Recent Labs  07/19/13 0525 07/21/13 0516  WBC 9.1 8.1  HGB 11.1* 10.9*  HCT 31.7* 31.2*  PLT 158 229   BMET:  Recent Labs  07/19/13 0525 07/21/13 0516  NA 141 141  K 3.8 3.7  CL 104 106  CO2 27 25  GLUCOSE 97 98  BUN 14 14  CREATININE 0.94 1.00  CALCIUM 8.3* 8.5    PT/INR: No results found for this basename: LABPROT, INR,  in the last 72 hours ABG    Component Value Date/Time   PHART 7.312* 07/16/2013 2039   HCO3 21.6 07/16/2013 2039   TCO2 22 07/17/2013 1713   ACIDBASEDEF 4.0* 07/16/2013 2039   O2SAT 97.0 07/16/2013 2039   CBG (last 3)   Recent Labs  07/18/13 0812  GLUCAP 101*    Assessment/Plan: S/P Procedure(s) (LRB): CORONARY ARTERY BYPASS GRAFTING times using bilateral internal mammary arteries and right leg saphenous vein  harvested endoscopically. (N/A)  1. CV- NSR remains Tachy, blood pressure controlled-continue Lopressor 2. Pulm- no acute issues, continued atelectasis, effusions improving on CXR encouraged use of IS at discharge 3. Renal- creatinine WNL. Lytes okay, mildly volume overloaded 4. ID- no longer febrile, UA negative, no Leukocytosis, Incisions C/D/I 5. Dispo- patient stable, will d/c home today   LOS: 5 days    Juan [name], Juan [name] 07/21/2013  Patient seen and examined, agree with above Fever resolved, tachycardia improved Home today

## 2013-08-04 ENCOUNTER — Ambulatory Visit (INDEPENDENT_AMBULATORY_CARE_PROVIDER_SITE_OTHER): Payer: 59 | Admitting: Cardiology

## 2013-08-04 ENCOUNTER — Encounter: Payer: Self-pay | Admitting: Cardiology

## 2013-08-04 VITALS — BP 98/64 | HR 86 | Ht 66.0 in | Wt 195.8 lb

## 2013-08-04 DIAGNOSIS — I2510 Atherosclerotic heart disease of native coronary artery without angina pectoris: Secondary | ICD-10-CM

## 2013-08-04 DIAGNOSIS — E785 Hyperlipidemia, unspecified: Secondary | ICD-10-CM

## 2013-08-04 DIAGNOSIS — E7800 Pure hypercholesterolemia, unspecified: Secondary | ICD-10-CM

## 2013-08-04 DIAGNOSIS — E669 Obesity, unspecified: Secondary | ICD-10-CM

## 2013-08-04 DIAGNOSIS — I10 Essential (primary) hypertension: Secondary | ICD-10-CM

## 2013-08-04 DIAGNOSIS — Z951 Presence of aortocoronary bypass graft: Secondary | ICD-10-CM

## 2013-08-04 LAB — LIPID PANEL
LDL Cholesterol: 50 mg/dL (ref 0–99)
Total CHOL/HDL Ratio: 3
VLDL: 26.8 mg/dL (ref 0.0–40.0)

## 2013-08-04 NOTE — Progress Notes (Signed)
[address] [place], [place]  [address] Phone: [phone] Fax:  [phone]  Date:  08/04/2013   ID:  Juan [name], DOB 1961-06-06, MRN [id number]  PCP:  Farris Has, MD   History of Present Illness: Juan [name] is a 52 y.o. male with severe multivessel coronary artery disease status post bypass 07/17/13, LIMA to LAD, free RIMA to obtuse marginal, SVG to PDA and posterolateral with ejection fraction of approximately 50% with mid inferior wall akinesis here for followup.  His symptoms began with typical anginal symptoms, high risk exercise treadmill test with 2 mm of ST segment depression diffusely Anginal symptoms. Nonsmoker. No early family history of CAD. Nondiabetic.  Walking one mile a day currently. Walking up hill in yard mild SHOB. Overall doing very well postoperatively. In fact, he is very eager to get back to work. He of course is not driving currently. He has an appointment with Dr. Dorris Fetch on the 28th.    Wt Readings from Last 3 Encounters:  08/04/13 195 lb 12.8 oz (88.814 kg)  07/21/13 201 lb 9.6 oz (91.445 kg)  07/21/13 201 lb 9.6 oz (91.445 kg)     Past Medical History  Diagnosis Date  . CAD (coronary artery disease)   . Pure hypercholesterolemia   . Other nonspecific abnormal cardiovascular system function study   . Chest pain, unspecified   . Other and unspecified angina pectoris   . Shortness of breath   . Kidney stones   . Family history of anesthesia complication     Mother had problems with BP dropping after surgery    Past Surgical History  Procedure Laterality Date  . Hernia repair      1963  . Cardiac catheterization    . Vasectomy    . Coronary artery bypass graft N/A 07/16/2013    Procedure: CORONARY ARTERY BYPASS GRAFTING times using bilateral internal mammary arteries and right leg saphenous vein harvested endoscopically.;  Surgeon: Loreli Slot, MD;  Location: [facility] OR;  Service: Open Heart Surgery;   Laterality: N/A;  BIMA, EVH    Current Outpatient Prescriptions  Medication Sig Dispense Refill  . aspirin EC 325 MG EC tablet Take 1 tablet (325 mg total) by mouth daily.  30 tablet  0  . atorvastatin (LIPITOR) 40 MG tablet Take 40 mg by mouth daily.      Marland Kitchen Fexofenadine HCl (ALLEGRA PO) Take 1 tablet by mouth daily as needed (allergies).      . metoprolol (LOPRESSOR) 50 MG tablet Take 1 tablet (50 mg total) by mouth 2 (two) times daily.  60 tablet  3  . naproxen sodium (ANAPROX) 220 MG tablet Take 220 mg by mouth daily as needed (for pain).       Marland Kitchen oxyCODONE (OXY IR/ROXICODONE) 5 MG immediate release tablet Take 1-2 tablets (5-10 mg total) by mouth every 4 (four) hours as needed.  30 tablet  0   No current facility-administered medications for this visit.    Allergies:   No Known Allergies  Social History:  The patient  reports that he has never smoked. He has never used smokeless tobacco. He reports that he does not drink alcohol or use illicit drugs.   ROS:  Please see the history of present illness.   No syncope, no bleeding, no orthopnea. Right arm was bothering him when picking up objects but this has resolved.   All other systems reviewed and negative.   PHYSICAL EXAM: VS:  BP 98/64  Pulse 86  Ht 5\' 6"  (1.676 m)  Wt 195 lb 12.8 oz (88.814 kg)  BMI 31.62 kg/m2  SpO2 96% Well nourished, well developed, in no acute distress HEENT: normal Neck: no JVD Cardiac:  normal S1, S2; RRR; no murmurHealing sternal wound. Lungs:  clear to auscultation bilaterally, no wheezing, rhonchi or rales Abd: soft, nontender, no hepatomegaly Ext: no edema Skin: warm and dry Neuro: no focal abnormalities noted    ASSESSMENT AND PLAN:  1.    coronary artery disease status post bypass. Overall doing very well. No anginal symptoms. Continue with current medications. Aspirin, beta blocker. I wanted him to discuss the possibility of driving with Dr. Dorris Fetch first. I explained to him that his  sternal wound is still healing 2.    Hyperlipidemia-check a lipid profile. Prior LDL was 153. He is currently on atorvastatin 40 mg. I would like for him to at least be less than 100 LDL. Continue with diet, exercise. Cardiac rehabilitation can help with this component. 3.     Obesity-good job with weight loss postoperatively.  Signed, Donato Schultz, MD [facility]  08/04/2013 1:37 PM

## 2013-08-04 NOTE — Patient Instructions (Signed)
Your physician recommends that you schedule a follow-up appointment in: 2 MONTHS WITH DR. Anne Fu  Your physician recommends that you return for lab work in: LIPID   Your physician recommends that you continue on your current medications as directed. Please refer to the Current Medication list given to you today.

## 2013-08-06 ENCOUNTER — Other Ambulatory Visit: Payer: Self-pay | Admitting: *Deleted

## 2013-08-06 DIAGNOSIS — I2510 Atherosclerotic heart disease of native coronary artery without angina pectoris: Secondary | ICD-10-CM

## 2013-08-10 ENCOUNTER — Ambulatory Visit
Admission: RE | Admit: 2013-08-10 | Discharge: 2013-08-10 | Disposition: A | Discharge status: Home / Self Care | Payer: 59 | Source: Ambulatory Visit | Attending: Thoracic Surgery (Cardiothoracic Vascular Surgery) | Admitting: Thoracic Surgery (Cardiothoracic Vascular Surgery)

## 2013-08-10 ENCOUNTER — Encounter: Payer: Self-pay | Admitting: Thoracic Surgery (Cardiothoracic Vascular Surgery)

## 2013-08-10 ENCOUNTER — Ambulatory Visit (INDEPENDENT_AMBULATORY_CARE_PROVIDER_SITE_OTHER): Payer: Self-pay | Admitting: Thoracic Surgery (Cardiothoracic Vascular Surgery)

## 2013-08-10 VITALS — BP 99/66 | HR 70 | Resp 16

## 2013-08-10 DIAGNOSIS — Z951 Presence of aortocoronary bypass graft: Secondary | ICD-10-CM

## 2013-08-10 DIAGNOSIS — I2510 Atherosclerotic heart disease of native coronary artery without angina pectoris: Secondary | ICD-10-CM

## 2013-08-10 NOTE — Progress Notes (Signed)
HPI:  [name] is today for scheduled postoperative followup visit. He is a 52 year old gentleman who had coronary bypass grafting x4 with bilateral mammary arteries on October 3. He was discharged on October 8 after an uncomplicated postoperative course.  He says his discharge his been doing well. He is walking about a mile a day. He's having minimal discomfort. He had been taking his pain medication at night before he went to bed, but has run out of that and has not taken any for the past several might. He says that he does not need any more pain medication. He went to work and worked all day on Friday. He is very anxious to resume full activities and returned to work.  Past Medical History  Diagnosis Date  . CAD (coronary artery disease)   . Pure hypercholesterolemia   . Other nonspecific abnormal cardiovascular system function study   . Chest pain, unspecified   . Other and unspecified angina pectoris   . Shortness of breath   . Kidney stones   . Family history of anesthesia complication     Mother had problems with BP dropping after surgery     Current Outpatient Prescriptions  Medication Sig Dispense Refill  . aspirin EC 325 MG EC tablet Take 1 tablet (325 mg total) by mouth daily.  30 tablet  0  . atorvastatin (LIPITOR) 40 MG tablet Take 40 mg by mouth daily.      Marland Kitchen Fexofenadine HCl (ALLEGRA PO) Take 1 tablet by mouth daily as needed (allergies).      . metoprolol (LOPRESSOR) 50 MG tablet Take 1 tablet (50 mg total) by mouth 2 (two) times daily.  60 tablet  3  . naproxen sodium (ANAPROX) 220 MG tablet Take 220 mg by mouth daily as needed (for pain).        No current facility-administered medications for this visit.    Physical Exam BP 99/66  Pulse 70  Resp 16  SpO3 48% 52 year old male in no acute distress Neuro intact Cardiac regular rate and rhythm normal S1-S2 Lungs clear with equal breath sounds bilaterally Sternum stable, incision clean dry and intact Leg  incision healing well, no peripheral edema  Diagnostic Tests: Chest x-ray 08/10/2013 EXAM:  CHEST 2 VIEW  COMPARISON: Chest x-ray of 07/21/2013  FINDINGS:  Aeration has improved, and the previous basilar atelectasis has  resolved. Only small pleural effusions remain. There is a vague  pleural based opacity in the periphery of the left midlung which is  not seen on the preoperative film the, and followup is recommended.  Mild cardiomegaly is stable.  IMPRESSION:  1. Improved aeration with only tiny effusions remaining.  2. Vague opacity in the periphery of the left mid lung appears to be  new compared to the preoperative chest x-ray. Recommend continued  followup.  Electronically Signed  By: Dwyane Dee M.D.  On: 08/10/2013 10:52  Impression: 52 year old gentleman who is now just less than a month out from coronary bypass grafting x4. He is doing extremely well. His exercise tolerance is good. He is having minimal discomfort. He is very anxious to return to full activities. Overall I am very pleased with his progress.  I recommended that he not lift anything over 10 pounds for another 2 weeks and nothing over 20 pounds for 4 weeks. Other than that his activities are unrestricted. He may begin driving on a limited basis. I recommended that he not drive on the highways for least another 2 weeks and to  limit himself to short trips around town.  I recommended that he wait 2 weeks before returning to work.  I am currently unable to view his chest x-ray. Based on the report there is a vague opacity in the left midlung. I will plan to see him back in 6 weeks with a repeat chest x-ray to make sure that area clears.   Plan: Return in 6 weeks with PA and lateral chest x-ray

## 2013-08-19 ENCOUNTER — Other Ambulatory Visit: Payer: Self-pay

## 2013-09-01 ENCOUNTER — Telehealth: Payer: Self-pay | Admitting: Cardiology

## 2013-09-01 NOTE — Telephone Encounter (Signed)
FYI     Pt's wife call to say pt has a head cold or sinus infection.   She will call the PCP now and may call us back if they do nothing.

## 2013-09-16 ENCOUNTER — Other Ambulatory Visit: Payer: Self-pay | Admitting: *Deleted

## 2013-09-16 DIAGNOSIS — I2510 Atherosclerotic heart disease of native coronary artery without angina pectoris: Secondary | ICD-10-CM

## 2013-09-17 ENCOUNTER — Other Ambulatory Visit: Payer: Self-pay | Admitting: *Deleted

## 2013-09-17 DIAGNOSIS — I2510 Atherosclerotic heart disease of native coronary artery without angina pectoris: Secondary | ICD-10-CM

## 2013-09-21 ENCOUNTER — Ambulatory Visit (INDEPENDENT_AMBULATORY_CARE_PROVIDER_SITE_OTHER): Payer: Self-pay | Admitting: Thoracic Surgery (Cardiothoracic Vascular Surgery)

## 2013-09-21 ENCOUNTER — Encounter: Payer: Self-pay | Admitting: Thoracic Surgery (Cardiothoracic Vascular Surgery)

## 2013-09-21 ENCOUNTER — Ambulatory Visit
Admission: RE | Admit: 2013-09-21 | Discharge: 2013-09-21 | Disposition: A | Discharge status: Home / Self Care | Payer: 59 | Source: Ambulatory Visit | Attending: Thoracic Surgery (Cardiothoracic Vascular Surgery) | Admitting: Thoracic Surgery (Cardiothoracic Vascular Surgery)

## 2013-09-21 VITALS — BP 105/69 | HR 69 | Resp 20 | Ht 66.0 in | Wt 200.0 lb

## 2013-09-21 DIAGNOSIS — Z951 Presence of aortocoronary bypass graft: Secondary | ICD-10-CM

## 2013-09-21 DIAGNOSIS — I2510 Atherosclerotic heart disease of native coronary artery without angina pectoris: Secondary | ICD-10-CM

## 2013-09-21 NOTE — Progress Notes (Signed)
  HPI:  Mr. Aycock is a 52 year old gentleman who underwent coronary bypass grafting back in October. He has done extremely well postoperatively. He has been back to work for about a month now. He is exercising on regular basis. He's not having any pain except when he coughs or sneezes.  On his postoperative followup chest x-ray on October 28 he was noted to have a small opacity in the left upper lobe. This was not present on his preoperative chest x-ray. Radiologist recommended followup to resolution.  Past Medical History  Diagnosis Date  . CAD (coronary artery disease)   . Pure hypercholesterolemia   . Other nonspecific abnormal cardiovascular system function study   . Chest pain, unspecified   . Other and unspecified angina pectoris   . Shortness of breath   . Kidney stones   . Family history of anesthesia complication     Mother had problems with BP dropping after surgery    Current Outpatient Prescriptions  Medication Sig Dispense Refill  . aspirin EC 325 MG EC tablet Take 1 tablet (325 mg total) by mouth daily.  30 tablet  0  . atorvastatin (LIPITOR) 40 MG tablet Take 40 mg by mouth daily.      Marland Kitchen Fexofenadine HCl (ALLEGRA PO) Take 1 tablet by mouth daily as needed (allergies).      . metoprolol (LOPRESSOR) 50 MG tablet Take 1 tablet (50 mg total) by mouth 2 (two) times daily.  60 tablet  3  . naproxen sodium (ANAPROX) 220 MG tablet Take 220 mg by mouth daily as needed (for pain).        No current facility-administered medications for this visit.    Physical Exam BP 105/69  Pulse 69  Resp 20  Ht 5\' 6"  (1.676 m)  Wt 200 lb (90.719 kg)  BMI 32.30 kg/m2  SpO27 51% 52 year old male in no acute distress Lungs clear with equal breath sounds bilaterally Cardiac regular rate and rhythm normal S1 and S2  Diagnostic Tests: Chest x-ray has not yet been officially read. Review of the films shows the opacity is smaller and less prominent.  Impression: 52 year old gentleman who  is now about 2 months out from coronary bypass grafting. He continues to do extremely well. His chest x-ray at his followup visit showed a left upper lobe opacity. This is likely a localized area of atelectasis or inflammation. Followup chest x-ray 6 week shows this to be improved but not completely resolved. I recommended that he return in 8 weeks for a repeat chest x-ray just to ensure resolution.  Plan: Return in a week PA and lateral chest x-ray.

## 2013-10-02 ENCOUNTER — Encounter: Payer: Self-pay | Admitting: Interventional Cardiology

## 2013-10-04 ENCOUNTER — Ambulatory Visit (INDEPENDENT_AMBULATORY_CARE_PROVIDER_SITE_OTHER): Payer: 59 | Admitting: Cardiology

## 2013-10-04 ENCOUNTER — Encounter: Payer: Self-pay | Admitting: Cardiology

## 2013-10-04 VITALS — BP 108/80 | HR 73 | Ht 66.0 in | Wt 202.0 lb

## 2013-10-04 DIAGNOSIS — I2510 Atherosclerotic heart disease of native coronary artery without angina pectoris: Secondary | ICD-10-CM

## 2013-10-04 DIAGNOSIS — I10 Essential (primary) hypertension: Secondary | ICD-10-CM

## 2013-10-04 DIAGNOSIS — E669 Obesity, unspecified: Secondary | ICD-10-CM

## 2013-10-04 DIAGNOSIS — E782 Mixed hyperlipidemia: Secondary | ICD-10-CM

## 2013-10-04 NOTE — Progress Notes (Signed)
[address] [place], [place]  [address] Phone: [phone] Fax:  [phone]  Date:  10/04/2013   ID:  Juan [name], DOB May 10, 1961, MRN [id number]  PCP:  Juan Has, MD   History of Present Illness: Juan [name] is a 52 y.o. male with severe multivessel coronary artery disease status post bypass 07/17/13, LIMA to LAD, free RIMA to obtuse marginal, SVG to PDA and posterolateral with ejection fraction of approximately 50% with mid inferior wall akinesis here for followup.  His symptoms began with typical anginal symptoms, high risk exercise treadmill test with 2 mm of ST segment depression diffusely Anginal symptoms. Nonsmoker. No early family history of CAD. Nondiabetic.  Walking one mile a day currently. Walking up hill in yard mild SHOB. Overall doing very well postoperatively. In fact, he is very eager to get back to work. He of course is not driving currently. He [name] an appointment with Dr. Dorris [name] on the 28th.    Wt Readings from Last 3 Encounters:  10/04/13 202 lb (91.627 kg)  09/21/13 200 lb (90.719 kg)  08/04/13 195 lb 12.8 oz (88.814 kg)     Past Medical History  Diagnosis Date  . CAD (coronary artery disease)   . Pure hypercholesterolemia   . Other nonspecific abnormal cardiovascular system function study   . Chest pain, unspecified   . Other and unspecified angina pectoris   . Shortness of breath   . Kidney stones   . Family history of anesthesia complication     Mother had problems with BP dropping after surgery    Past Surgical History  Procedure Laterality Date  . Hernia repair      1963  . Cardiac catheterization    . Vasectomy    . Coronary artery bypass graft N/A 07/16/2013    Procedure: CORONARY ARTERY BYPASS GRAFTING times using bilateral internal mammary arteries and right leg saphenous vein harvested endoscopically.;  Surgeon: Loreli Slot, MD;  Location: [facility] OR;  Service: Open Heart Surgery;  Laterality: N/A;   BIMA, EVH    Current Outpatient Prescriptions  Medication Sig Dispense Refill  . aspirin EC 325 MG EC tablet Take 1 tablet (325 mg total) by mouth daily.  30 tablet  0  . atorvastatin (LIPITOR) 40 MG tablet Take 40 mg by mouth daily.      Marland Kitchen Fexofenadine HCl (ALLEGRA PO) Take 1 tablet by mouth daily as needed (allergies).      . metoprolol (LOPRESSOR) 50 MG tablet Take 1 tablet (50 mg total) by mouth 2 (two) times daily.  60 tablet  3  . naproxen sodium (ANAPROX) 220 MG tablet Take 220 mg by mouth daily as needed (for pain).        No current facility-administered medications for this visit.    Allergies:   No Known Allergies  Social History:  The patient  reports that he [name] never smoked. He [name] never used smokeless tobacco. He reports that he does not drink alcohol or use illicit drugs.   ROS:  Please see the history of present illness.   No syncope, no bleeding, no orthopnea. Right arm was bothering him when picking up objects but this [name] resolved.   All other systems reviewed and negative.   PHYSICAL EXAM: VS:  BP 108/80  Pulse 73  Ht 5\' 6"  (1.676 m)  Wt 202 lb (91.627 kg)  BMI 32.62 kg/m2 Well nourished, well developed, in no acute distress HEENT: normal Neck:  no JVD Cardiac:  normal S1, S2; RRR; no murmurHealing sternal wound. Lungs:  clear to auscultation bilaterally, no wheezing, rhonchi or rales Abd: soft, nontender, no hepatomegaly Ext: no edema Skin: warm and dry Neuro: no focal abnormalities noted   EKG: 10/04/13-sinus rhythm, 73, old inferior infarct pattern.  ASSESSMENT AND PLAN:  1.    Coronary artery disease status post bypass. Overall doing very well. No anginal symptoms. Continue with current medications. Aspirin, beta blocker. Working in [employer].  2.    Hyperlipidemia-check a lipid profile. Prior LDL was 153. Now 53. Great. He is currently on atorvastatin 40 mg. I would like for him to at least be less than 100 LDL.Marland Kitchen Continue  with diet, exercise. Cardiac rehabilitation can help with this component. 3.    Obesity-good job with weight loss postoperatively.  Signed, Donato Schultz, MD [facility]  10/04/2013 9:32 AM

## 2013-10-04 NOTE — Patient Instructions (Signed)
Your physician recommends that you continue on your current medications as directed. Please refer to the Current Medication list given to you today.  Your physician wants you to follow-up in: 6 months with Dr. Skains. You will receive a reminder letter in the mail two months in advance. If you don't receive a letter, please call our office to schedule the follow-up appointment.  

## 2013-10-05 ENCOUNTER — Encounter: Payer: 59 | Admitting: Thoracic Surgery (Cardiothoracic Vascular Surgery)

## 2013-10-11 ENCOUNTER — Encounter: Payer: Self-pay | Admitting: Cardiology

## 2013-10-19 NOTE — Telephone Encounter (Signed)
Left message on patient voicemail that is ok to start a multi vitamin with other medications.

## 2013-11-15 ENCOUNTER — Other Ambulatory Visit: Payer: Self-pay | Admitting: *Deleted

## 2013-11-15 DIAGNOSIS — I2510 Atherosclerotic heart disease of native coronary artery without angina pectoris: Secondary | ICD-10-CM

## 2013-11-16 ENCOUNTER — Ambulatory Visit (INDEPENDENT_AMBULATORY_CARE_PROVIDER_SITE_OTHER): Payer: 59 | Admitting: Thoracic Surgery (Cardiothoracic Vascular Surgery)

## 2013-11-16 ENCOUNTER — Encounter: Payer: Self-pay | Admitting: Thoracic Surgery (Cardiothoracic Vascular Surgery)

## 2013-11-16 ENCOUNTER — Ambulatory Visit
Admission: RE | Admit: 2013-11-16 | Discharge: 2013-11-16 | Disposition: A | Discharge status: Home / Self Care | Payer: 59 | Source: Ambulatory Visit | Attending: Thoracic Surgery (Cardiothoracic Vascular Surgery) | Admitting: Thoracic Surgery (Cardiothoracic Vascular Surgery)

## 2013-11-16 ENCOUNTER — Other Ambulatory Visit: Payer: Self-pay | Admitting: Physician Assistant

## 2013-11-16 VITALS — BP 106/70 | HR 76 | Resp 16 | Ht 66.0 in | Wt 202.0 lb

## 2013-11-16 DIAGNOSIS — I2510 Atherosclerotic heart disease of native coronary artery without angina pectoris: Secondary | ICD-10-CM

## 2013-11-16 DIAGNOSIS — Z951 Presence of aortocoronary bypass graft: Secondary | ICD-10-CM

## 2013-11-16 NOTE — Progress Notes (Signed)
  HPI:  Mr. Duffey returns today for followup of a left upper lobe abnormality seen on postoperative chest x-ray. He underwent coronary bypass grafting back in October. At his 3 week postoperative visit, the chest x-ray showed an opacity in the left upper lobe. We did a followup chest x-ray in 6 weeks in the area had improved but was not completely resolved. He now returns for a 8 week followup chest x-ray.  He says they've been doing well. He's back to work full-time. His activities are back to normal. He denies any cough, hemoptysis, shortness of breath, wheezing, or chest pain.  Past Medical History  Diagnosis Date  . CAD (coronary artery disease)   . Pure hypercholesterolemia   . Other nonspecific abnormal cardiovascular system function study   . Chest pain, unspecified   . Other and unspecified angina pectoris   . Shortness of breath   . Kidney stones   . Family history of anesthesia complication     Mother had problems with BP dropping after surgery      Current Outpatient Prescriptions  Medication Sig Dispense Refill  . aspirin EC 81 MG tablet Take 81 mg by mouth daily.      Marland Kitchen atorvastatin (LIPITOR) 40 MG tablet Take 40 mg by mouth daily.      Marland Kitchen Fexofenadine HCl (ALLEGRA PO) Take 1 tablet by mouth daily as needed (allergies).      . metoprolol (LOPRESSOR) 50 MG tablet Take 1 tablet (50 mg total) by mouth 2 (two) times daily.  60 tablet  3  . naproxen sodium (ANAPROX) 220 MG tablet Take 220 mg by mouth daily as needed (for pain).        No current facility-administered medications for this visit.    Physical Exam 53 year old male in no acute distress Well-developed well-nourished Alert and oriented  Diagnostic Tests: CHEST 2 VIEW  COMPARISON: DG CHEST 2 VIEW dated 09/21/2013  FINDINGS:  Low lung volumes. Patient status post median sternotomy coronary  artery bypass grafting. There are no focal regions of consolidation  no focal infiltrates. The osseous structures  unremarkable.  IMPRESSION:  No evidence of acute cardiopulmonary disease.  Electronically Signed  By: Margaree Mackintosh M.D.  On: 11/16/2013 11:46   Impression: 53 year old gentleman who is now about 4 months post coronary bypass grafting. There was a small area of consolidation on the 3 week followup chest x-ray. That has now completely resolved. There is no evidence of active disease. He is doing well from a cardiac standpoint.  He'll continue to follow with Dr. Candee Furbish. I will be happy to see him back any time if I can be of any further assistance with his care.  Plan: Return as needed

## 2013-11-18 ENCOUNTER — Other Ambulatory Visit: Payer: Self-pay | Admitting: Cardiology

## 2014-03-19 ENCOUNTER — Other Ambulatory Visit: Payer: Self-pay | Admitting: Cardiology

## 2014-04-28 ENCOUNTER — Encounter: Payer: Self-pay | Admitting: Cardiology

## 2014-04-28 ENCOUNTER — Ambulatory Visit (INDEPENDENT_AMBULATORY_CARE_PROVIDER_SITE_OTHER): Payer: 59 | Admitting: Cardiology

## 2014-04-28 VITALS — BP 113/68 | HR 62 | Ht 66.0 in | Wt 207.0 lb

## 2014-04-28 DIAGNOSIS — E7800 Pure hypercholesterolemia, unspecified: Secondary | ICD-10-CM

## 2014-04-28 DIAGNOSIS — E669 Obesity, unspecified: Secondary | ICD-10-CM

## 2014-04-28 DIAGNOSIS — Z951 Presence of aortocoronary bypass graft: Secondary | ICD-10-CM

## 2014-04-28 DIAGNOSIS — E785 Hyperlipidemia, unspecified: Secondary | ICD-10-CM

## 2014-04-28 DIAGNOSIS — I2510 Atherosclerotic heart disease of native coronary artery without angina pectoris: Secondary | ICD-10-CM

## 2014-04-28 NOTE — Progress Notes (Signed)
[place]. [address] [place], [place]  [address] Phone: [phone] Fax:  [phone]  Date:  04/28/2014   ID:  Juan [name], DOB October 25, 1960, MRN [id number]  PCP:  London Pepper, MD   History of Present Illness: Juan [name] is a 53 y.o. male with severe multivessel coronary artery disease status post bypass 07/17/13, LIMA to LAD, free RIMA to obtuse marginal, SVG to PDA and posterolateral with ejection fraction of approximately 50% with mid inferior wall akinesis here for followup.  His symptoms began with typical anginal symptoms, high risk exercise treadmill test with 2 mm of ST segment depression diffusely Anginal symptoms. Nonsmoker. No early family history of CAD. Nondiabetic.  Doing very well. No chest pain, no shortness of breath. Exercising. Weight gain slightly.    Wt Readings from Last 3 Encounters:  04/28/14 207 lb (93.895 kg)  11/16/13 202 lb (91.627 kg)  10/04/13 202 lb (91.627 kg)     Past Medical History  Diagnosis Date  . CAD (coronary artery disease)   . Pure hypercholesterolemia   . Other nonspecific abnormal cardiovascular system function study   . Chest pain, unspecified   . Other and unspecified angina pectoris   . Shortness of breath   . Kidney stones   . Family history of anesthesia complication     Mother had problems with BP dropping after surgery    Past Surgical History  Procedure Laterality Date  . Hernia repair      1963  . Cardiac catheterization    . Vasectomy    . Coronary artery bypass graft N/A 07/16/2013    Procedure: CORONARY ARTERY BYPASS GRAFTING times using bilateral internal mammary arteries and right leg saphenous vein harvested endoscopically.;  Surgeon: Melrose Nakayama, MD;  Location: [facility];  Service: Open Heart Surgery;  Laterality: N/A;  BIMA, EVH    Current Outpatient Prescriptions  Medication Sig Dispense Refill  . aspirin EC 81 MG tablet Take 81 mg by mouth daily.      Marland Kitchen atorvastatin (LIPITOR)  40 MG tablet Take 40 mg by mouth daily.      . Cholecalciferol (VITAMIN D-3 PO) Take 1 tablet by mouth daily.      . Cyanocobalamin (VITAMIN B-12 PO) Take 1 tablet by mouth daily.      Marland Kitchen Fexofenadine HCl (ALLEGRA PO) Take 1 tablet by mouth daily as needed (allergies).      . metoprolol (LOPRESSOR) 50 MG tablet TAKE 1 TABLET BY MOUTH TWICE A DAY  60 tablet  1  . Multiple Vitamin (MULTIVITAMIN) capsule Take 1 capsule by mouth daily.      . naproxen sodium (ANAPROX) 220 MG tablet Take 220 mg by mouth daily as needed (for pain).        No current facility-administered medications for this visit.    Allergies:   No Known Allergies  Social History:  The patient  reports that he has never smoked. He has never used smokeless tobacco. He reports that he does not drink alcohol or use illicit drugs.   ROS:  Please see the history of present illness.   No syncope, no bleeding, no orthopnea. Right arm was bothering him when picking up objects but this has resolved.   All other systems reviewed and negative.   PHYSICAL EXAM: VS:  BP 113/68  Pulse 62  Ht 5\' 6"  (1.676 m)  Wt 207 lb (93.895 kg)  BMI 33.43 kg/m2 Well nourished, well developed, in no  acute distress HEENT: normal Neck: no JVD Cardiac:  normal S1, S2; RRR; no murmurHealed sternal wound. Lungs:  clear to auscultation bilaterally, no wheezing, rhonchi or rales Abd: soft, nontender, no hepatomegaly Ext: no edema Skin: warm and dry Neuro: no focal abnormalities noted   EKG: 10/04/13-sinus rhythm, 73, old inferior infarct pattern.  ASSESSMENT AND PLAN:  1.    Coronary artery disease status post bypass. Overall doing very well. No anginal symptoms. Continue with current medications. Aspirin, beta blocker. Working in [employer].  2.    Hyperlipidemia-check a lipid profile. Prior LDL was 153. Now 79. Great. He is currently on atorvastatin 40 mg. I would like for him to at least be less than 100 LDL.Marland Kitchen Continue with  diet, exercise.  3.    Obesity-good job with weight loss postoperatively. He careful, weight has increased slightly. We have discussed. Decrease overall caloric intake. We will see back in one year.  Signed, Candee Furbish, MD [facility]  04/28/2014 10:55 AM

## 2014-04-28 NOTE — Patient Instructions (Signed)
The current medical regimen is effective;  continue present plan and medications.  Follow up in 1 year with Dr Skains.  You will receive a letter in the mail 2 months before you are due.  Please call us when you receive this letter to schedule your follow up appointment.  

## 2014-05-17 ENCOUNTER — Other Ambulatory Visit: Payer: Self-pay | Admitting: Cardiology

## 2014-08-09 ENCOUNTER — Other Ambulatory Visit: Payer: Self-pay

## 2014-08-09 MED ORDER — ATORVASTATIN CALCIUM 40 MG PO TABS: 40.0000 mg | ORAL_TABLET | Freq: Every day | ORAL | Status: DC

## 2014-09-22 ENCOUNTER — Encounter (HOSPITAL_COMMUNITY): Payer: Self-pay | Admitting: Cardiology

## 2014-11-17 ENCOUNTER — Other Ambulatory Visit: Payer: Self-pay | Admitting: Cardiology

## 2014-11-26 IMAGING — CR DG CHEST 2V
2 series · 2 of 2 positions shown · non-contrast
Comparison: None

CLINICAL DATA: Preoperative respiratory exam for CABG.

EXAM:
CHEST - 2 VIEW

[w chest pa]
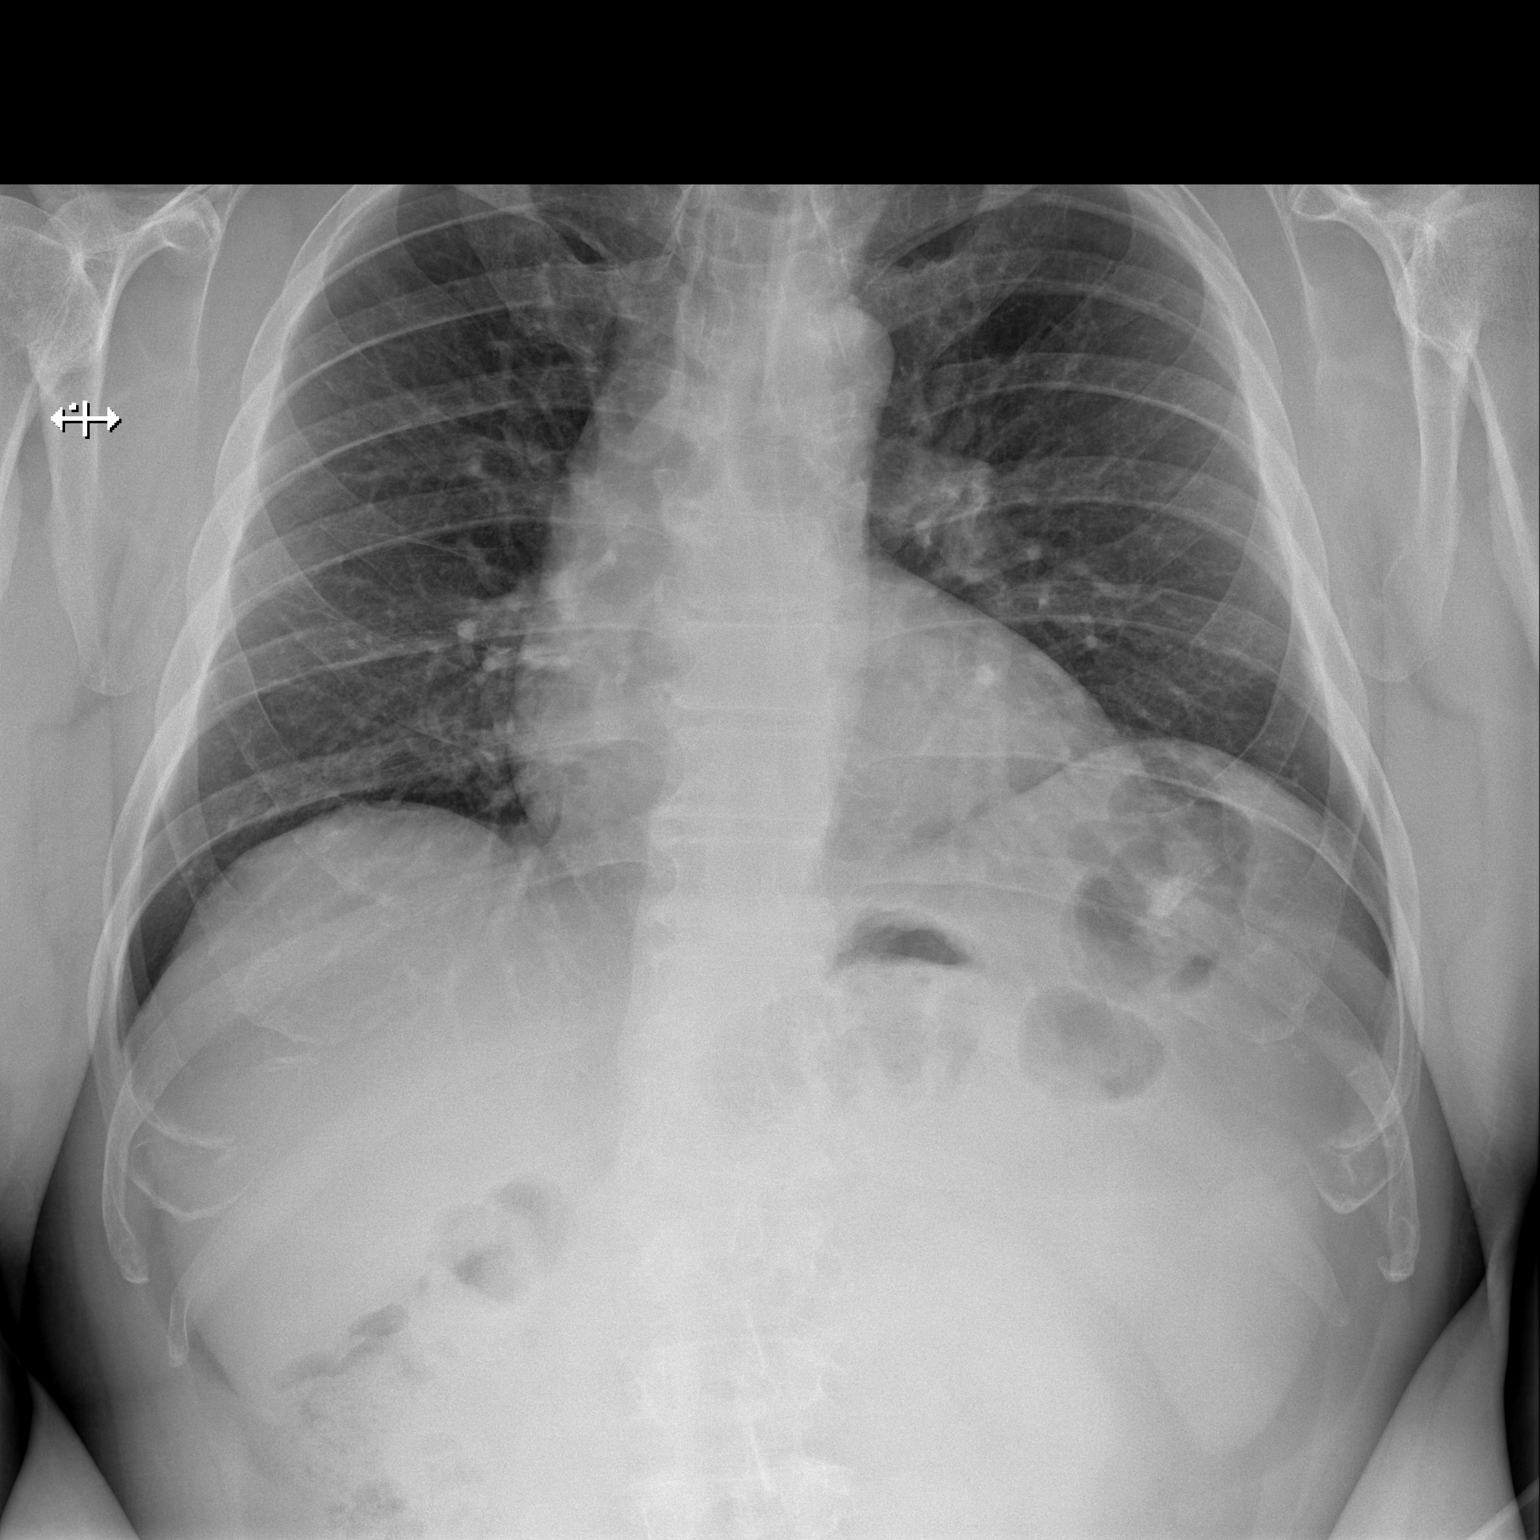

[w chest lat]
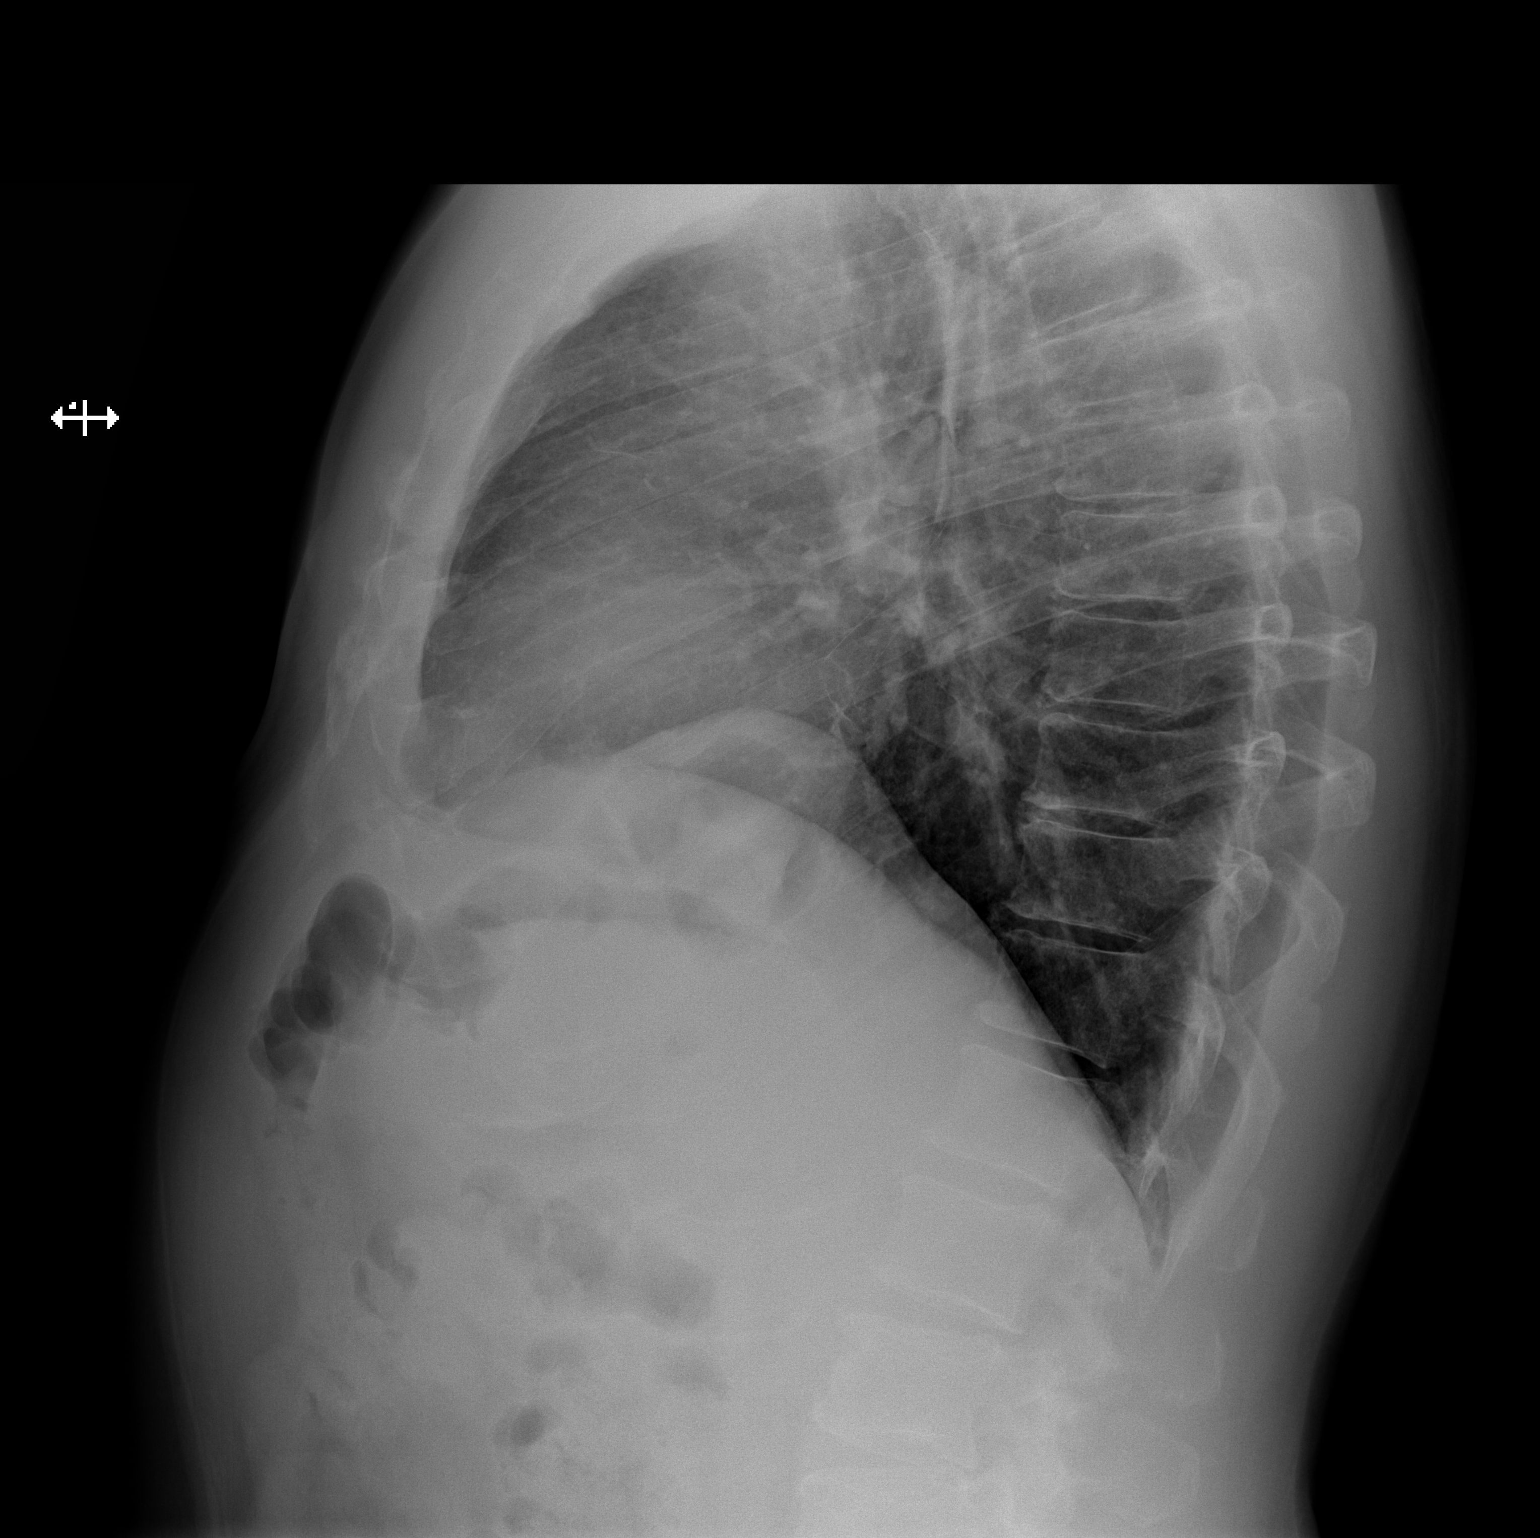

[2 of 2 positions shown; findings below may reference images not displayed]

FINDINGS: The heart size and mediastinal contours are within normal limits.
There is no evidence of pulmonary edema, consolidation,
pneumothorax, nodule or pleural fluid. The visualized skeletal
structures are unremarkable.
IMPRESSION: No active disease.

## 2014-11-29 IMAGING — CR DG CHEST 1V PORT
1 series · 1 of 1 positions shown · non-contrast
Comparison: 07/16/2013

CLINICAL DATA: Status post CABG

EXAM:
PORTABLE CHEST - 1 VIEW

[AP]
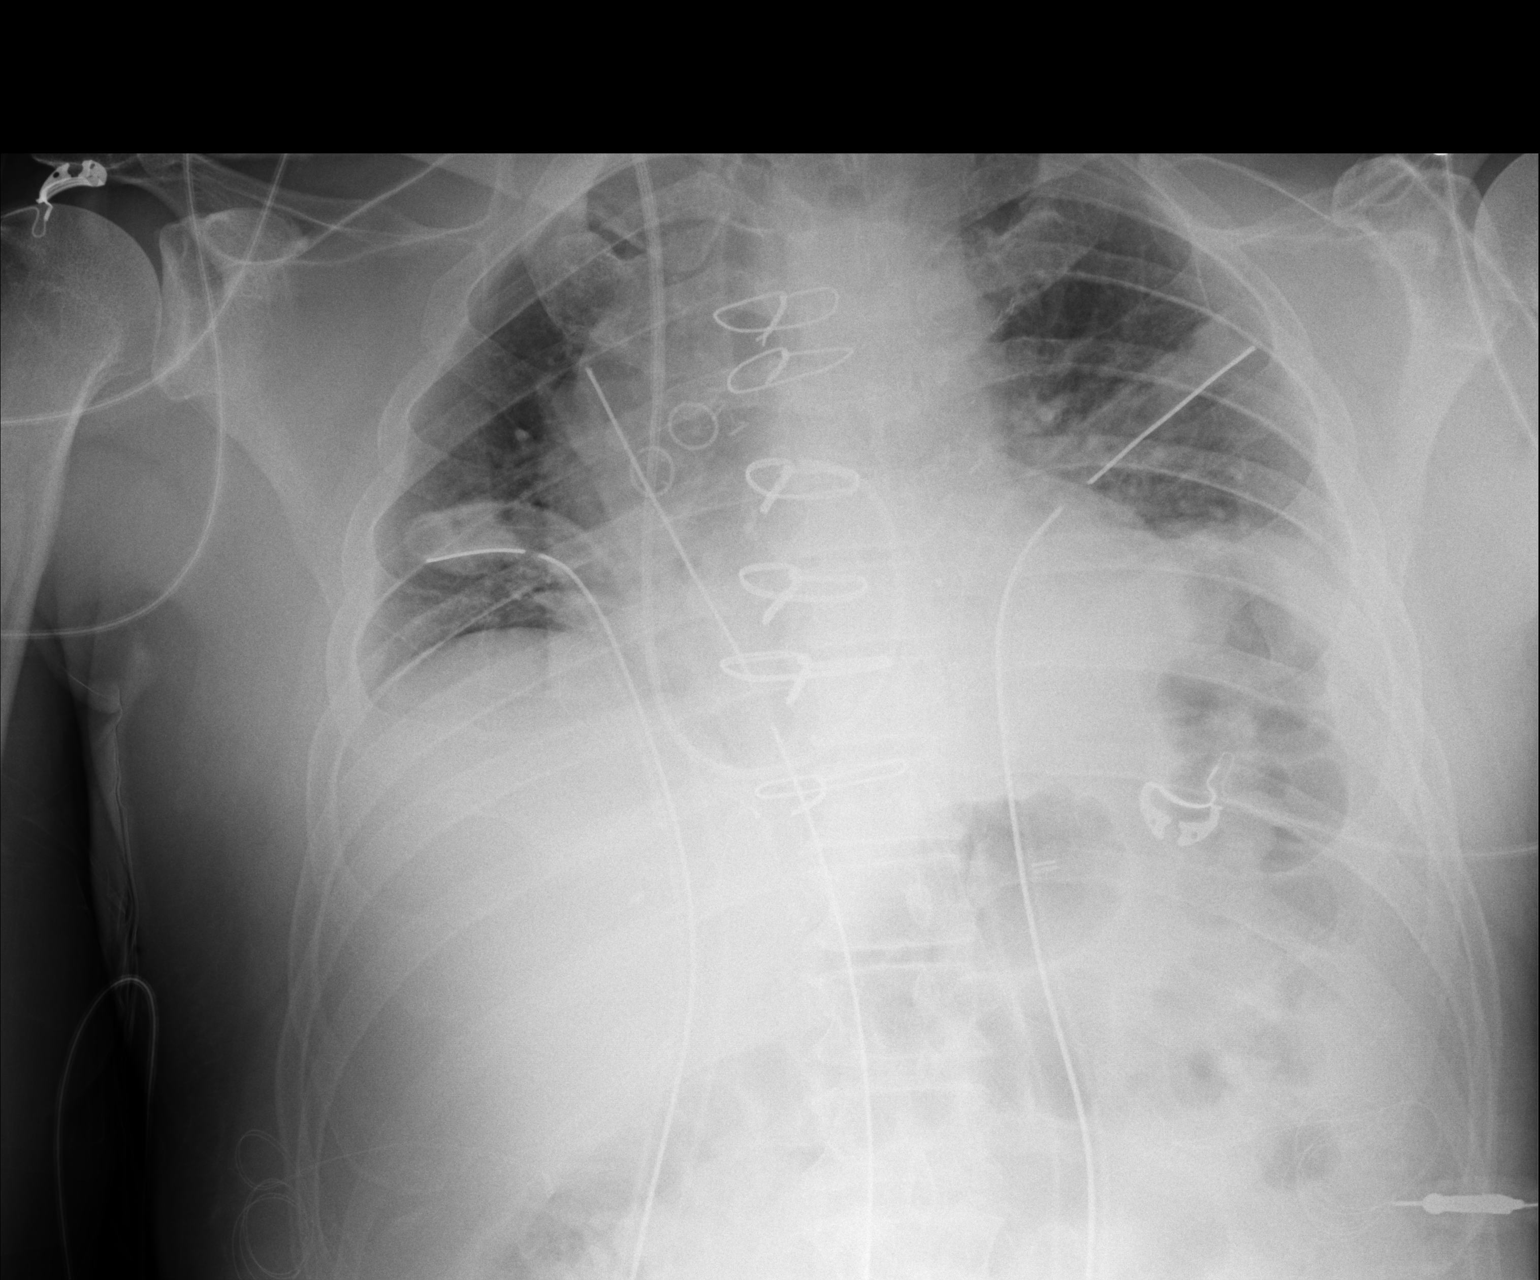

[1 of 1 positions shown; findings below may reference images not displayed]

FINDINGS: There is a Swan-Ganz catheter with tip in the main pulmonary artery.
There are 2 right-sided chest tubes and 1 left chest tube. The
nasogastric tube and endotracheal tube have been removed. Stable
cardiac enlargement. Decreased lung volumes and small pleural
effusions noted. No significant interstitial edema. No pneumothorax
identified.
IMPRESSION: 1. Interval removal of the ET tube and nasogastric tube.

2. Stable position of chest tubes without significant a
pneumothorax.

## 2014-11-30 IMAGING — CR DG CHEST 1V PORT
1 series · 1 of 1 positions shown · non-contrast
Comparison: Chest radiograph 07/17/2013

CLINICAL DATA: Postop cardiac surgery

EXAM:
PORTABLE CHEST - 1 VIEW

[AP]
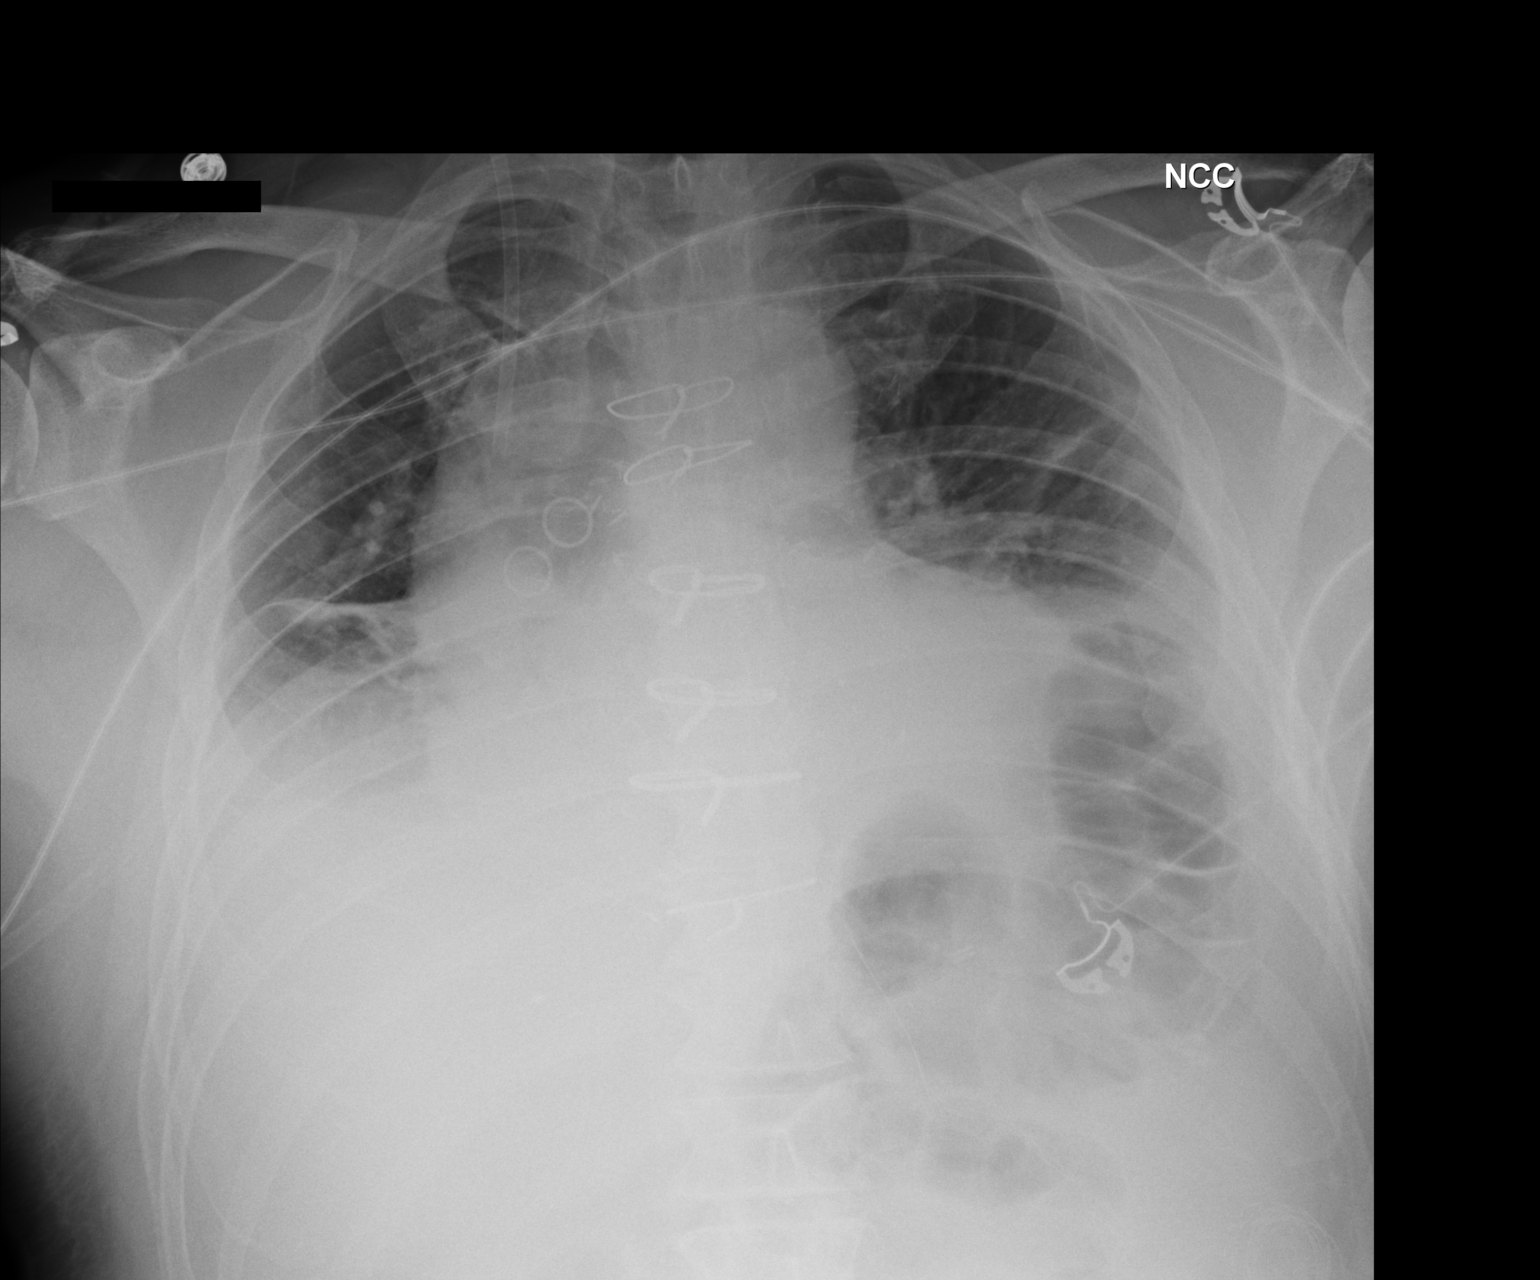

[1 of 1 positions shown; findings below may reference images not displayed]

FINDINGS: Sternotomy wires overlie normal cardiac silhouette. Retraction of
Swan-Ganz catheter. There has been removal of the mediastinal drain
and bilateral chest tubes. No pneumothorax evident. There is
bibasilar atelectasis which is slightly increased compared to prior.
Likely small effusions.
IMPRESSION: 1.  Interval removal of chest tubes without pneumothorax.

2.  Mild increase in basilar atelectasis .

## 2014-12-23 IMAGING — CR DG CHEST 2V
2 series · 2 of 2 positions shown · non-contrast
Comparison: Chest x-ray of 07/21/2013

CLINICAL DATA: CABG, followup

EXAM:
CHEST  2 VIEW

[w chest pa]
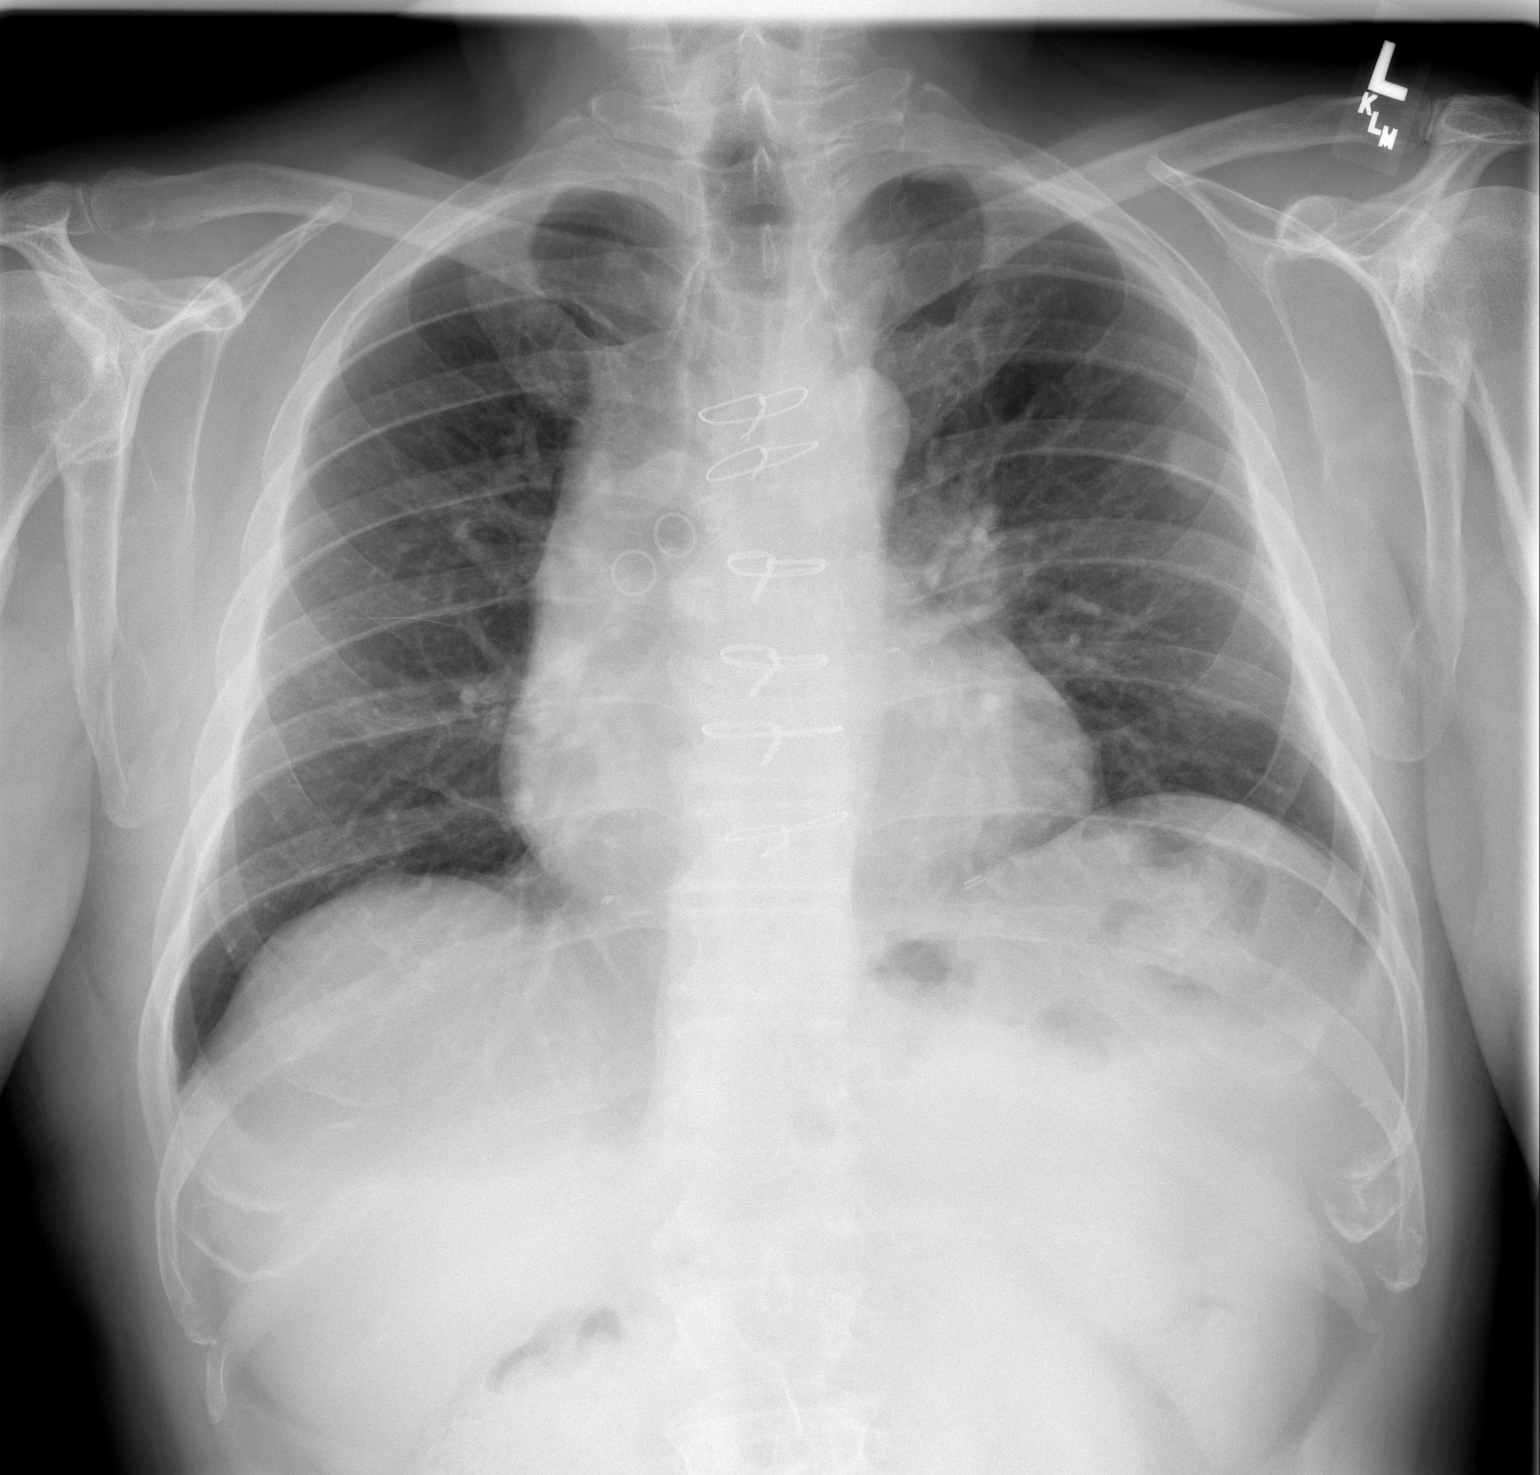

[w chest lat]
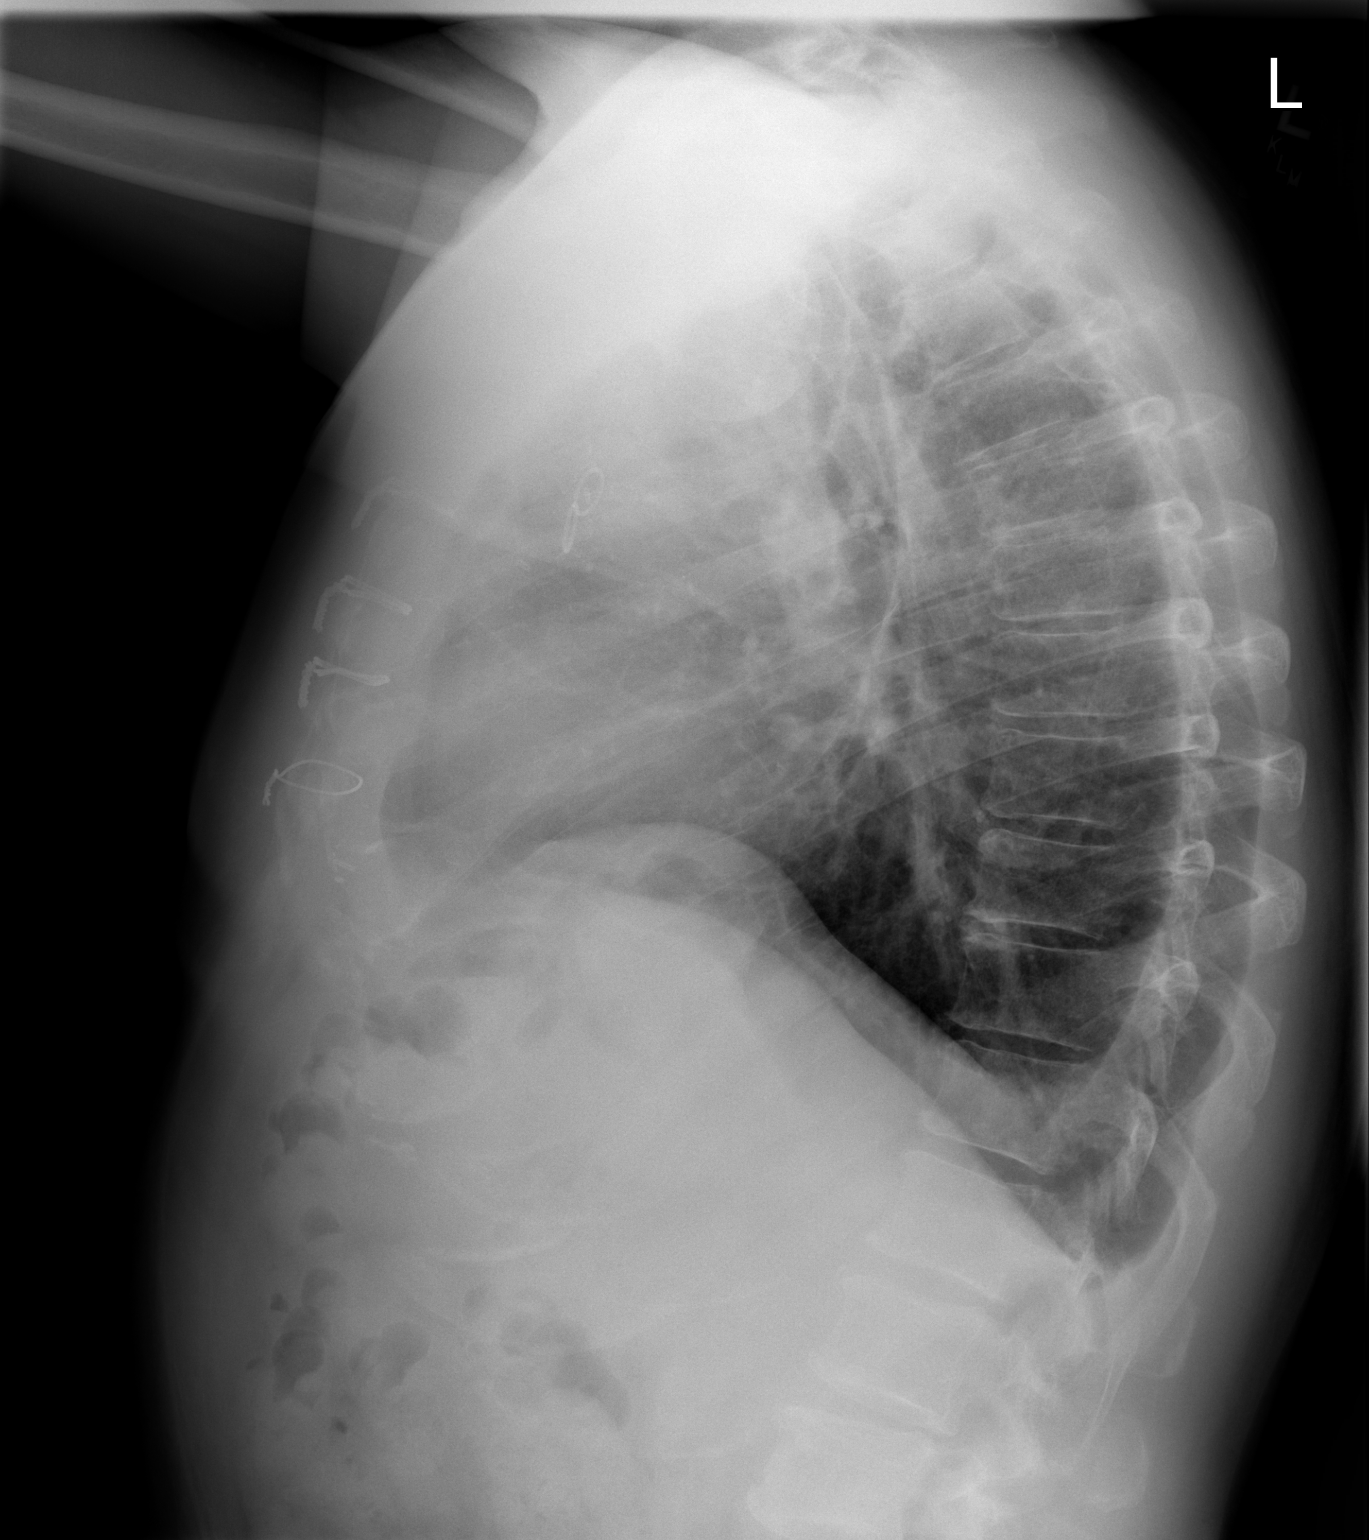

[2 of 2 positions shown; findings below may reference images not displayed]

FINDINGS: Aeration has improved, and the previous basilar atelectasis has
resolved. Only small pleural effusions remain. There is a vague
pleural based opacity in the periphery of the left midlung which is
not seen on the preoperative film the, and followup is recommended.
Mild cardiomegaly is stable.
IMPRESSION: 1. Improved aeration with only tiny effusions remaining.
2. Vague opacity in the periphery of the left mid lung appears to be
new compared to the preoperative chest x-ray. Recommend continued
followup.

## 2015-01-18 ENCOUNTER — Other Ambulatory Visit: Payer: Self-pay | Admitting: Cardiology

## 2015-03-20 ENCOUNTER — Other Ambulatory Visit: Payer: Self-pay | Admitting: Cardiology

## 2015-05-16 ENCOUNTER — Encounter: Payer: Self-pay | Admitting: Cardiology

## 2015-05-16 ENCOUNTER — Ambulatory Visit (INDEPENDENT_AMBULATORY_CARE_PROVIDER_SITE_OTHER): Payer: Commercial Managed Care - HMO | Admitting: Cardiology

## 2015-05-16 VITALS — BP 124/76 | HR 68 | Ht 66.0 in | Wt 204.8 lb

## 2015-05-16 DIAGNOSIS — E669 Obesity, unspecified: Secondary | ICD-10-CM | POA: Diagnosis not present

## 2015-05-16 DIAGNOSIS — E7800 Pure hypercholesterolemia, unspecified: Secondary | ICD-10-CM

## 2015-05-16 DIAGNOSIS — Z951 Presence of aortocoronary bypass graft: Secondary | ICD-10-CM | POA: Diagnosis not present

## 2015-05-16 DIAGNOSIS — I2510 Atherosclerotic heart disease of native coronary artery without angina pectoris: Secondary | ICD-10-CM | POA: Diagnosis not present

## 2015-05-16 DIAGNOSIS — I2583 Coronary atherosclerosis due to lipid rich plaque: Principal | ICD-10-CM

## 2015-05-16 NOTE — Progress Notes (Addendum)
[place]. [address], [address] [place], [place]  [address] Phone: [phone] Fax:  [phone]  Date:  05/16/2015   ID:  Juan [name], DOB April 18, 1961, MRN [id number]  PCP:  London Pepper, MD   History of Present Illness: Juan [name] is a 54 y.o. male with severe multivessel coronary artery disease status post bypass 07/17/13, LIMA to LAD, free RIMA to obtuse marginal, SVG to PDA and posterolateral with ejection fraction of approximately 50% with mid inferior wall akinesis here for followup.  His symptoms began with typical anginal symptoms, high risk exercise treadmill test with 2 mm of ST segment depression diffusely Anginal symptoms. Nonsmoker. No early family history of CAD. Nondiabetic.  Doing very well. No chest pain, no shortness of breath. Exercising. I would like for him to lose approximately 10 pounds.      Wt Readings from Last 3 Encounters:  05/16/15 204 lb 12 oz (92.874 kg)  04/28/14 207 lb (93.895 kg)  11/16/13 202 lb (91.627 kg)     Past Medical History  Diagnosis Date  . CAD (coronary artery disease)   . Pure hypercholesterolemia   . Other nonspecific abnormal cardiovascular system function study   . Chest pain, unspecified   . Other and unspecified angina pectoris   . Shortness of breath   . Kidney stones   . Family history of anesthesia complication     Mother had problems with BP dropping after surgery    Past Surgical History  Procedure Laterality Date  . Hernia repair      1963  . Cardiac catheterization    . Vasectomy    . Coronary artery bypass graft N/A 07/16/2013    Procedure: CORONARY ARTERY BYPASS GRAFTING times using bilateral internal mammary arteries and right leg saphenous vein harvested endoscopically.;  Surgeon: Melrose Nakayama, MD;  Location: [facility];  Service: Open Heart Surgery;  Laterality: N/A;  BIMA, EVH  . Left heart catheterization with coronary angiogram N/A 07/09/2013    Procedure: LEFT HEART CATHETERIZATION WITH  CORONARY ANGIOGRAM;  Surgeon: Candee Furbish, MD;  Location[facility] LAB;  Service: Cardiovascular;  Laterality: N/A;  . Fractional flow reserve wire  07/09/2013    Procedure: FRACTIONAL FLOW RESERVE WIRE;  Surgeon: Candee Furbish, MD;  Location: [facility] CATH LAB;  Service: Cardiovascular;;    Current Outpatient Prescriptions  Medication Sig Dispense Refill  . aspirin EC 81 MG tablet Take 81 mg by mouth daily.    Marland Kitchen atorvastatin (LIPITOR) 40 MG tablet Take 1 tablet (40 mg total) by mouth daily. 90 tablet 3  . Fexofenadine HCl (ALLEGRA PO) Take 1 tablet by mouth daily as needed (allergies).    . metoprolol (LOPRESSOR) 50 MG tablet TAKE 1 TABLET BY MOUTH TWICE A DAY 60 tablet 5  . Multiple Vitamin (MULTIVITAMIN) capsule Take 1 capsule by mouth daily.    . naproxen sodium (ANAPROX) 220 MG tablet Take 220 mg by mouth daily as needed (for pain).      No current facility-administered medications for this visit.    Allergies:   No Known Allergies  Social History:  The patient  reports that he has never smoked. He has never used smokeless tobacco. He reports that he does not drink alcohol or use illicit drugs.   ROS:  Please see the history of present illness.   No syncope, no bleeding, no orthopnea. Right arm was bothering him when picking up objects but this has resolved.   All other systems  reviewed and negative.   PHYSICAL EXAM: VS:  BP 124/76 mmHg  Pulse 68  Ht 5\' 6"  (1.676 m)  Wt 204 lb 12 oz (92.874 kg)  BMI 33.06 kg/m2  SpO2 94% Well nourished, well developed, in no acute distress HEENT: normal Neck: no JVD Cardiac:  normal S1, S2; RRR; no murmurHealed sternal wound. Lungs:  clear to auscultation bilaterally, no wheezing, rhonchi or rales Abd: soft, nontender, no hepatomegaly Ext: no edema Skin: warm and dry Neuro: no focal abnormalities noted   EKG: Today 05/16/2015-normal sinus rhythm, inferior Q waves. No change 10/04/13-sinus rhythm, 73, old inferior infarct pattern.  ASSESSMENT AND  PLAN:  1.    Coronary artery disease status post bypass. Overall doing very well. No anginal symptoms. Continue with current medications. Aspirin, beta blocker. Working in [employer]. Also works as a Cabin crew.  2.    Hyperlipidemia-Prior LDL was 153. Now 70. Great. He is currently on atorvastatin 40 mg.  Continue with diet, exercise.  3.    Obesity-good job with weight loss postoperatively. He careful, weight has increased slightly. We have discussed. Decrease overall caloric intake. Would like him to try to decrease 10 pounds. His BMI is 33. We will see back in one year.  Signed, Candee Furbish, MD [facility]  05/16/2015 5:26 PM

## 2015-05-16 NOTE — Addendum Note (Signed)
Addended by: Shellia Cleverly on: 05/16/2015 05:48 PM   Modules accepted: Orders, Level of Service

## 2015-05-16 NOTE — Patient Instructions (Signed)
Medication Instructions:  The current medical regimen is effective;  continue present plan and medications.  Follow-Up: Follow up in 1 year with Dr. Skains.  You will receive a letter in the mail 2 months before you are due.  Please call us when you receive this letter to schedule your follow up appointment.  Thank you for choosing [facility]!!     

## 2015-05-30 ENCOUNTER — Other Ambulatory Visit: Payer: Self-pay | Admitting: Cardiology

## 2015-07-05 ENCOUNTER — Other Ambulatory Visit: Payer: Self-pay | Admitting: Cardiology

## 2016-01-09 ENCOUNTER — Ambulatory Visit: Payer: Self-pay | Admitting: General Surgery

## 2016-01-09 NOTE — H&P (Signed)
History of Present Illness Juan Ok MD; 01/09/2016 10:02 AM) Patient words: hernia.  The patient is a 55 year old male who presents with an inguinal hernia. The patient is a 55 year old male who is referred by Dr. London Pepper for evaluation of a left inguinal hernia. Patient states she's had this hernia there for 4-5 years. He states got more bothersome and slightly increased in size.  Of note patient states he had a left inguinal hernia at the age of 8.  Patient has had a CABG in the past. He currently sees Dr. Marlou Porch as his cardiologist.   Other Problems (Ammie Eversole, LPN; 579FGE 075-GRM AM) Hypercholesterolemia Inguinal Hernia Kidney Stone Other disease, cancer, significant illness  Past Surgical History Aleatha Borer, LPN; 579FGE 075-GRM AM) Coronary Artery Bypass Graft Open Inguinal Hernia Surgery Right. Vasectomy  Diagnostic Studies History (Ammie Eversole, LPN; 579FGE 075-GRM AM) Colonoscopy never  Allergies (Ammie Eversole, LPN; 579FGE D34-534 AM) No Known Drug Allergies03/28/2017  Medication History (Ammie Eversole, LPN; 579FGE D34-534 AM) Atorvastatin Calcium (40MG  Tablet, Oral) Active. Metoprolol Tartrate (50MG  Tablet, Oral) Active. Aspirin (81MG  Tablet Chewable, Oral) Active. Naproxen Sodium (220MG  Capsule, Oral) Active. Medications Reconciled  Social History (Ammie Eversole, LPN; 579FGE 075-GRM AM) Caffeine use Carbonated beverages, Coffee, Tea. No alcohol use No drug use Tobacco use Never smoker.    Review of Systems (Ammie Eversole LPN; 579FGE 075-GRM AM) General Not Present- Appetite Loss, Chills, Fatigue, Fever, Night Sweats, Weight Gain and Weight Loss. Skin Not Present- Change in Wart/Mole, Dryness, Hives, Jaundice, New Lesions, Non-Healing Wounds, Rash and Ulcer. HEENT Not Present- Earache, Hearing Loss, Hoarseness, Nose Bleed, Oral Ulcers, Ringing in the Ears, Seasonal Allergies, Sinus Pain, Sore Throat, Visual  Disturbances, Wears glasses/contact lenses and Yellow Eyes. Respiratory Not Present- Bloody sputum, Chronic Cough, Difficulty Breathing, Snoring and Wheezing. Breast Not Present- Breast Mass, Breast Pain, Nipple Discharge and Skin Changes. Cardiovascular Not Present- Chest Pain, Difficulty Breathing Lying Down, Leg Cramps, Palpitations, Rapid Heart Rate, Shortness of Breath and Swelling of Extremities. Gastrointestinal Not Present- Abdominal Pain, Bloating, Bloody Stool, Change in Bowel Habits, Chronic diarrhea, Constipation, Difficulty Swallowing, Excessive gas, Gets full quickly at meals, Hemorrhoids, Indigestion, Nausea, Rectal Pain and Vomiting. Male Genitourinary Not Present- Blood in Urine, Change in Urinary Stream, Frequency, Impotence, Nocturia, Painful Urination, Urgency and Urine Leakage. Musculoskeletal Not Present- Back Pain, Joint Pain, Joint Stiffness, Muscle Pain, Muscle Weakness and Swelling of Extremities. Neurological Not Present- Decreased Memory, Fainting, Headaches, Numbness, Seizures, Tingling, Tremor, Trouble walking and Weakness. Psychiatric Not Present- Anxiety, Bipolar, Change in Sleep Pattern, Depression, Fearful and Frequent crying. Endocrine Not Present- Cold Intolerance, Excessive Hunger, Hair Changes, Heat Intolerance, Hot flashes and New Diabetes. Hematology Not Present- Easy Bruising, Excessive bleeding, Gland problems, HIV and Persistent Infections.  Vitals (Ammie Eversole LPN; 579FGE 075-GRM AM) 01/09/2016 9:49 AM Weight: 198.6 lb Height: 64in Body Surface Area: 1.95 m Body Mass Index: 34.09 kg/m  Temp.: 98.20F(Oral)  Pulse: 80 (Regular)  BP: 110/72 (Sitting, Left Arm, Standard)       Physical Exam Juan Ok MD; 01/09/2016 10:02 AM) General Mental Status-Alert. General Appearance-Consistent with stated age. Hydration-Well hydrated. Voice-Normal.  Head and Neck Head-normocephalic, atraumatic with no lesions or palpable  masses. Trachea-midline.  Eye Eyeball - Bilateral-Extraocular movements intact. Sclera/Conjunctiva - Bilateral-No scleral icterus.  Chest and Lung Exam Chest and lung exam reveals -quiet, even and easy respiratory effort with no use of accessory muscles. Inspection Chest Wall - Normal. Back - normal.  Cardiovascular Cardiovascular examination reveals -normal heart sounds, regular  rate and rhythm with no murmurs.  Abdomen Inspection Hernias - Inguinal hernia - Left - Reducible. Palpation/Percussion Normal exam - Soft, Non Tender, No Rebound tenderness, No Rigidity (guarding) and No hepatosplenomegaly. Auscultation Normal exam - Bowel sounds normal.  Neurologic Neurologic evaluation reveals -alert and oriented x 3 with no impairment of recent or remote memory. Mental Status-Normal.  Musculoskeletal Normal Exam - Left-Upper Extremity Strength Normal and Lower Extremity Strength Normal. Normal Exam - Right-Upper Extremity Strength Normal, Lower Extremity Weakness.    Assessment & Plan Juan Ok MD; 01/09/2016 10:03 AM) LEFT INGUINAL HERNIA (K40.90) Impression: 55 year old male with a left inguinal hernia.  1. Will proceed with cardiac clearance 2. The patient will like to proceed to the operating room for laparoscopic LEFT inguinal hernia repair with mesh.  3. I discussed with the patient the signs and symptoms of incarceration and strangulation and the need to proceed to the ER should they occur.  4. I discussed with the patient the risks and benefits of the procedure to include but not limited to: Infection, bleeding, damage to surrounding structures, possible need for further surgery, possible nerve pain, and possible recurrence. The patient was understanding and wishes to proceed.

## 2016-01-15 ENCOUNTER — Ambulatory Visit (INDEPENDENT_AMBULATORY_CARE_PROVIDER_SITE_OTHER): Payer: Commercial Managed Care - HMO | Admitting: Cardiology

## 2016-01-15 ENCOUNTER — Encounter: Payer: Self-pay | Admitting: Cardiology

## 2016-01-15 ENCOUNTER — Encounter: Payer: Self-pay | Admitting: *Deleted

## 2016-01-15 VITALS — BP 128/68 | HR 74 | Ht 67.0 in | Wt 199.0 lb

## 2016-01-15 DIAGNOSIS — E7800 Pure hypercholesterolemia, unspecified: Secondary | ICD-10-CM

## 2016-01-15 DIAGNOSIS — Z951 Presence of aortocoronary bypass graft: Secondary | ICD-10-CM

## 2016-01-15 DIAGNOSIS — Z01810 Encounter for preprocedural cardiovascular examination: Secondary | ICD-10-CM | POA: Diagnosis not present

## 2016-01-15 DIAGNOSIS — K4030 Unilateral inguinal hernia, with obstruction, without gangrene, not specified as recurrent: Secondary | ICD-10-CM

## 2016-01-15 DIAGNOSIS — E785 Hyperlipidemia, unspecified: Secondary | ICD-10-CM

## 2016-01-15 DIAGNOSIS — I2510 Atherosclerotic heart disease of native coronary artery without angina pectoris: Secondary | ICD-10-CM

## 2016-01-15 NOTE — Progress Notes (Signed)
[facility], [place]  [address] Phone: [phone] Fax:  [phone]  Date:  01/15/2016   ID:  Juan [name], DOB 1961/10/08, MRN [id number]  PCP:  London Pepper, MD   History of Present Illness: Juan [name] is a 55 y.o. male with severe multivessel coronary artery disease status post bypass 07/17/13, LIMA to LAD, free RIMA to obtuse marginal, SVG to PDA and posterolateral with ejection fraction of approximately 50% with mid inferior wall akinesis here for followup.  His symptoms began with typical anginal symptoms, high risk exercise treadmill test with 2 mm of ST segment depression diffusely Anginal symptoms. Nonsmoker. No early family history of CAD. Nondiabetic.  Doing very well. No chest pain, no shortness of breath. Exercising. Able to run upstairs, no shortness of breath. He is actually contemplating jogging. Overall doing well. Needs a hernia repair. No orthopnea, no bleeding.    Wt Readings from Last 3 Encounters:  01/15/16 199 lb (90.266 kg)  05/16/15 204 lb 12 oz (92.874 kg)  04/28/14 207 lb (93.895 kg)     Past Medical History  Diagnosis Date  . CAD (coronary artery disease)   . Pure hypercholesterolemia   . Other nonspecific abnormal cardiovascular system function study   . Chest pain, unspecified   . Other and unspecified angina pectoris   . Shortness of breath   . Kidney stones   . Family history of anesthesia complication     Mother had problems with BP dropping after surgery    Past Surgical History  Procedure Laterality Date  . Hernia repair      1963  . Cardiac catheterization    . Vasectomy    . Coronary artery bypass graft N/A 07/16/2013    Procedure: CORONARY ARTERY BYPASS GRAFTING times using bilateral internal mammary arteries and right leg saphenous vein harvested endoscopically.;  Surgeon: Melrose Nakayama, MD;  Location: [facility];  Service: Open Heart Surgery;  Laterality: N/A;  BIMA, EVH  . Left heart  catheterization with coronary angiogram N/A 07/09/2013    Procedure: LEFT HEART CATHETERIZATION WITH CORONARY ANGIOGRAM;  Surgeon: Candee Furbish, MD;  Location: [facility] CATH LAB;  Service: Cardiovascular;  Laterality: N/A;  . Fractional flow reserve wire  07/09/2013    Procedure: FRACTIONAL FLOW RESERVE WIRE;  Surgeon: Candee Furbish, MD;  Location: [facility] CATH LAB;  Service: Cardiovascular;;    Current Outpatient Prescriptions  Medication Sig Dispense Refill  . aspirin EC 81 MG tablet Take 81 mg by mouth daily.    Marland Kitchen atorvastatin (LIPITOR) 40 MG tablet TAKE 1 TABLET (40 MG TOTAL) BY MOUTH DAILY. 90 tablet 2  . Fexofenadine HCl (ALLEGRA PO) Take 1 tablet by mouth daily as needed (allergies).    . metoprolol (LOPRESSOR) 50 MG tablet TAKE 1 TABLET BY MOUTH TWICE A DAY 60 tablet 11  . naproxen sodium (ANAPROX) 220 MG tablet Take 220 mg by mouth daily as needed (for pain).      No current facility-administered medications for this visit.    Allergies:   No Known Allergies  Social History:  The patient  reports that he has never smoked. He has never used smokeless tobacco. He reports that he does not drink alcohol or use illicit drugs.   ROS:  Please see the history of present illness.   No syncope, no bleeding, no orthopnea. Right arm was bothering him when picking up objects but this has resolved.   All other systems reviewed and  negative.   PHYSICAL EXAM: VS:  BP 128/68 mmHg  Pulse 74  Ht 5\' 7"  (1.702 m)  Wt 199 lb (90.266 kg)  BMI 31.16 kg/m2 Well nourished, well developed, in no acute distress HEENT: normal Neck: no JVD Cardiac:  normal S1, S2; RRR; no murmurHealed sternal wound. Lungs:  clear to auscultation bilaterally, no wheezing, rhonchi or rales Abd: soft, nontender, no hepatomegaly Ext: no edema Skin: warm and dry Neuro: no focal abnormalities noted   EKG: 05/16/2015-normal sinus rhythm, inferior Q waves. No change, personally viewed 10/04/13-sinus rhythm, 73, old inferior infarct  pattern.  ASSESSMENT AND PLAN:  Preoperative risk stratification-hernia repair, Juan [name] -He is able to complete greater than 4 METS of activity without difficulty. He walks almost 3-1/2 miles a day around [place]. Able to run up the stairs without difficulty. No angina. No syncope, no orthopnea. He does snore. If it is desired that he be off of aspirin for 5 days, this is fine. Minimal risk. They understand. He may proceed with low overall cardiac risk for surgery. Prior revascularization in 2014, CABG.  Coronary artery disease status post bypass in 2014.  -Overall doing very well. No anginal symptoms. Continue with current medications. Aspirin, beta blocker. Working in [employer]. Also works as a Cabin crew.   Hyperlipidemia-Prior LDL was 153. Now 68. Great. He is currently on atorvastatin 40 mg.  Continue with diet, exercise.   Obesity -good job with weight loss.   We will see back in one year.  Signed, Candee Furbish, MD [facility]  01/15/2016 11:26 AM

## 2016-01-15 NOTE — Patient Instructions (Signed)
Medication Instructions:  The current medical regimen is effective;  continue present plan and medications.  Follow-Up: Follow up in 1 year with Dr. Marlou Porch.  You will receive a letter in the mail 2 months before you are due.  Please call us when you receive this letter to schedule your follow up appointment.  If you need a refill on your cardiac medications before your next appointment, please call your pharmacy.  You have been given clearance for your upcoming surgery as planned.  Thank you for choosing [facility]!!

## 2016-02-05 NOTE — Patient Instructions (Addendum)
YOUR PROCEDURE IS SCHEDULED ON :  02/14/16  REPORT TO [facility] MAIN ENTRANCE FOLLOW SIGNS TO EAST ELEVATOR - GO TO 3rd FLOOR CHECK IN AT 3 EAST NURSES STATION (SHORT STAY) AT:  8:00 AM  CALL THIS NUMBER IF YOU HAVE PROBLEMS THE MORNING OF SURGERY [phone]  REMEMBER:ONLY 1 PER PERSON MAY GO TO SHORT STAY WITH YOU TO GET READY THE MORNING OF YOUR SURGERY  DO NOT EAT FOOD OR DRINK LIQUIDS AFTER MIDNIGHT  TAKE THESE MEDICINES THE MORNING OF SURGERY: METOPROLOL  YOU MAY NOT HAVE ANY METAL ON YOUR BODY INCLUDING HAIR PINS AND PIERCING'S. DO NOT WEAR JEWELRY, MAKEUP, LOTIONS, POWDERS OR PERFUMES. DO NOT WEAR NAIL POLISH. DO NOT SHAVE 48 HRS PRIOR TO SURGERY. MEN MAY SHAVE FACE AND NECK.  DO NOT Juan [name]. Juan [name] IS NOT RESPONSIBLE FOR VALUABLES.  CONTACTS, DENTURES OR PARTIALS MAY NOT BE WORN TO SURGERY. LEAVE SUITCASE IN CAR. CAN BE BROUGHT TO ROOM AFTER SURGERY.  PATIENTS DISCHARGED THE DAY OF SURGERY WILL NOT BE ALLOWED TO DRIVE HOME.  PLEASE READ OVER THE FOLLOWING INSTRUCTION SHEETS _________________________________________________________________________________                                          [name]  Before surgery, you can play an important role.  Because skin is not sterile, your skin needs to be as free of germs as possible.  You can reduce the number of germs on your skin by washing with CHG (chlorahexidine gluconate) soap before surgery.  CHG is an antiseptic cleaner which kills germs and bonds with the skin to continue killing germs even after washing. Please DO NOT use if you have an allergy to CHG or antibacterial soaps.  If your skin becomes reddened/irritated stop using the CHG and inform your nurse when you arrive at Short Stay. Do not shave (including legs and underarms) for at least 48 hours prior to the first CHG shower.  You may shave your face. Please follow these instructions  carefully:   1.  Shower with CHG Soap the night before surgery and the  morning of Surgery.   2.  If you choose to wash your hair, wash your hair first as usual with your  normal  Shampoo.   3.  After you shampoo, rinse your hair and body thoroughly to remove the  shampoo.                                         4.  Use CHG as you would any other liquid soap.  You can apply chg directly  to the skin and wash . Gently wash with scrungie or clean wascloth    5.  Apply the CHG Soap to your body ONLY FROM THE NECK DOWN.   Do not use on open                           Wound or open sores. Avoid contact with eyes, ears mouth and genitals (private parts).                        Genitals (private parts) with your normal soap.  6.  Wash thoroughly, paying special attention to the area where your surgery  will be performed.   7.  Thoroughly rinse your body with warm water from the neck down.   8.  DO NOT shower/wash with your normal soap after using and rinsing off  the CHG Soap .                9.  Pat yourself dry with a clean towel.             10.  Wear clean night clothes to bed after shower             11.  Place clean sheets on your bed the night of your first shower and do not  sleep with pets.  Day of Surgery : Do not apply any lotions/deodorants the morning of surgery.  Please wear clean clothes to the hospital/surgery center.  FAILURE TO FOLLOW THESE INSTRUCTIONS MAY RESULT IN THE CANCELLATION OF YOUR SURGERY    PATIENT SIGNATURE_________________________________  ______________________________________________________________________

## 2016-02-07 ENCOUNTER — Encounter (HOSPITAL_COMMUNITY): Payer: Self-pay

## 2016-02-07 ENCOUNTER — Encounter (HOSPITAL_COMMUNITY)
Admission: RE | Admit: 2016-02-07 | Discharge: 2016-02-07 | Disposition: A | Discharge status: Home / Self Care | Payer: Commercial Managed Care - HMO | Source: Ambulatory Visit | Attending: General Surgery | Admitting: General Surgery

## 2016-02-07 DIAGNOSIS — Z01812 Encounter for preprocedural laboratory examination: Secondary | ICD-10-CM | POA: Diagnosis present

## 2016-02-07 HISTORY — DX: Adverse effect of unspecified anesthetic, initial encounter: T41.45XA

## 2016-02-07 HISTORY — DX: Unilateral inguinal hernia, without obstruction or gangrene, not specified as recurrent: K40.90

## 2016-02-07 HISTORY — DX: Other complications of anesthesia, initial encounter: T88.59XA

## 2016-02-07 LAB — BASIC METABOLIC PANEL WITH GFR
Anion gap: 8 (ref 5–15)
BUN: 24 mg/dL — ABNORMAL HIGH (ref 6–20)
CO2: 26 mmol/L (ref 22–32)
Calcium: 9.1 mg/dL (ref 8.9–10.3)
Chloride: 109 mmol/L (ref 101–111)
Creatinine, Ser: 1.28 mg/dL — ABNORMAL HIGH (ref 0.61–1.24)
GFR calc Af Amer: >60 mL/min (ref >60)
GFR calc non Af Amer: >60 mL/min (ref >60)
Glucose, Bld: 87 mg/dL (ref 65–99)
Potassium: 4.3 mmol/L (ref 3.5–5.1)
Sodium: 143 mmol/L (ref 135–145)

## 2016-02-07 LAB — CBC
HCT: 39.7 % (ref 39.0–52.0)
Hemoglobin: 13.9 g/dL (ref 13.0–17.0)
MCH: 30.3 pg (ref 26.0–34.0)
MCHC: 35 g/dL (ref 30.0–36.0)
MCV: 86.7 fL (ref 78.0–100.0)
Platelets: 198 K/uL (ref 150–400)
RBC: 4.58 MIL/uL (ref 4.22–5.81)
RDW: 12.7 % (ref 11.5–15.5)
WBC: 5.9 K/uL (ref 4.0–10.5)

## 2016-02-07 NOTE — Progress Notes (Signed)
   02/07/16 1053  OBSTRUCTIVE SLEEP APNEA  Have you ever been diagnosed with sleep apnea through a sleep study? No  Do you snore loudly (loud enough to be heard through closed doors)?  1  Do you often feel tired, fatigued, or sleepy during the daytime (such as falling asleep during driving or talking to someone)? 1  Has anyone observed you stop breathing during your sleep? 0  Do you have, or are you being treated for high blood pressure? 1  BMI more than 35 kg/m2? 0  Age > 50 (1-yes) 1  Neck circumference greater than:Male 16 inches or larger, Male 17inches or larger? 1  Male Gender (Yes=1) 1  Obstructive Sleep Apnea Score 6  Score 5 or greater  Results sent to PCP

## 2016-02-13 ENCOUNTER — Telehealth: Payer: Self-pay | Admitting: *Deleted

## 2016-02-13 NOTE — Telephone Encounter (Signed)
Entered in error

## 2016-02-14 ENCOUNTER — Encounter (HOSPITAL_COMMUNITY)
Admission: RE | Disposition: A | Discharge status: Home / Self Care | Payer: Self-pay | Source: Ambulatory Visit | Attending: General Surgery

## 2016-02-14 ENCOUNTER — Ambulatory Visit (HOSPITAL_COMMUNITY)
Admission: RE | Admit: 2016-02-14 | Discharge: 2016-02-14 | Disposition: A | Discharge status: Home / Self Care | Payer: Commercial Managed Care - HMO | Source: Ambulatory Visit | Attending: General Surgery | Admitting: General Surgery

## 2016-02-14 ENCOUNTER — Ambulatory Visit (HOSPITAL_COMMUNITY): Payer: Commercial Managed Care - HMO | Admitting: Anesthesiology

## 2016-02-14 ENCOUNTER — Encounter (HOSPITAL_COMMUNITY): Payer: Self-pay

## 2016-02-14 DIAGNOSIS — Z79899 Other long term (current) drug therapy: Secondary | ICD-10-CM | POA: Insufficient documentation

## 2016-02-14 DIAGNOSIS — Z7982 Long term (current) use of aspirin: Secondary | ICD-10-CM | POA: Insufficient documentation

## 2016-02-14 DIAGNOSIS — K4090 Unilateral inguinal hernia, without obstruction or gangrene, not specified as recurrent: Secondary | ICD-10-CM | POA: Insufficient documentation

## 2016-02-14 DIAGNOSIS — Z791 Long term (current) use of non-steroidal anti-inflammatories (NSAID): Secondary | ICD-10-CM | POA: Diagnosis not present

## 2016-02-14 DIAGNOSIS — Z951 Presence of aortocoronary bypass graft: Secondary | ICD-10-CM | POA: Insufficient documentation

## 2016-02-14 DIAGNOSIS — D176 Benign lipomatous neoplasm of spermatic cord: Secondary | ICD-10-CM | POA: Diagnosis not present

## 2016-02-14 DIAGNOSIS — I10 Essential (primary) hypertension: Secondary | ICD-10-CM | POA: Diagnosis not present

## 2016-02-14 DIAGNOSIS — E7800 Pure hypercholesterolemia, unspecified: Secondary | ICD-10-CM | POA: Diagnosis not present

## 2016-02-14 DIAGNOSIS — I2510 Atherosclerotic heart disease of native coronary artery without angina pectoris: Secondary | ICD-10-CM | POA: Diagnosis not present

## 2016-02-14 HISTORY — PX: INGUINAL HERNIA REPAIR: SHX194

## 2016-02-14 HISTORY — PX: INSERTION OF MESH: SHX5868

## 2016-02-14 SURGERY — REPAIR, HERNIA, INGUINAL, LAPAROSCOPIC
Anesthesia: General | Site: Abdomen | Laterality: Left

## 2016-02-14 MED ORDER — ACETAMINOPHEN 650 MG RE SUPP
650.0000 mg | RECTAL | Status: DC | PRN
  Filled 2016-02-14: qty 1

## 2016-02-14 MED ORDER — OXYCODONE-ACETAMINOPHEN 5-325 MG PO TABS: 1.0000 | ORAL_TABLET | ORAL | Status: DC | PRN

## 2016-02-14 MED ORDER — CEFAZOLIN SODIUM-DEXTROSE 2-4 GM/100ML-% IV SOLN
2.0000 g | INTRAVENOUS | Status: AC
  Administered 2016-02-14: 2 g via INTRAVENOUS
  Filled 2016-02-14: qty 100

## 2016-02-14 MED ORDER — CEFAZOLIN SODIUM-DEXTROSE 2-4 GM/100ML-% IV SOLN
INTRAVENOUS | Status: AC
  Filled 2016-02-14: qty 100

## 2016-02-14 MED ORDER — METOCLOPRAMIDE HCL 5 MG/ML IJ SOLN: 10.0000 mg | Freq: Once | INTRAMUSCULAR | Status: DC | PRN

## 2016-02-14 MED ORDER — ONDANSETRON HCL 4 MG/2ML IJ SOLN
INTRAMUSCULAR | Status: AC
Start: 2016-02-14 — End: 2016-02-14
  Filled 2016-02-14: qty 2

## 2016-02-14 MED ORDER — MIDAZOLAM HCL 2 MG/2ML IJ SOLN
INTRAMUSCULAR | Status: AC
  Filled 2016-02-14: qty 2

## 2016-02-14 MED ORDER — SUGAMMADEX SODIUM 200 MG/2ML IV SOLN
INTRAVENOUS | Status: DC | PRN
  Administered 2016-02-14: 200 mg via INTRAVENOUS

## 2016-02-14 MED ORDER — SUGAMMADEX SODIUM 200 MG/2ML IV SOLN
INTRAVENOUS | Status: AC
  Filled 2016-02-14: qty 2

## 2016-02-14 MED ORDER — ROCURONIUM BROMIDE 100 MG/10ML IV SOLN
INTRAVENOUS | Status: DC | PRN
  Administered 2016-02-14: 50 mg via INTRAVENOUS

## 2016-02-14 MED ORDER — FENTANYL CITRATE (PF) 100 MCG/2ML IJ SOLN: 25.0000 ug | INTRAMUSCULAR | Status: DC | PRN

## 2016-02-14 MED ORDER — FENTANYL CITRATE (PF) 250 MCG/5ML IJ SOLN
INTRAMUSCULAR | Status: AC
  Filled 2016-02-14: qty 5

## 2016-02-14 MED ORDER — PHENYLEPHRINE HCL 10 MG/ML IJ SOLN
INTRAMUSCULAR | Status: DC | PRN
  Administered 2016-02-14 (×2): 80 ug via INTRAVENOUS
  Administered 2016-02-14 (×2): 40 ug via INTRAVENOUS

## 2016-02-14 MED ORDER — PROPOFOL 10 MG/ML IV BOLUS
INTRAVENOUS | Status: AC
  Filled 2016-02-14: qty 20

## 2016-02-14 MED ORDER — CHLORHEXIDINE GLUCONATE 4 % EX LIQD: 1.0000 "application " | Freq: Once | CUTANEOUS | Status: DC

## 2016-02-14 MED ORDER — ACETAMINOPHEN 325 MG PO TABS: 650.0000 mg | ORAL_TABLET | ORAL | Status: DC | PRN

## 2016-02-14 MED ORDER — SODIUM CHLORIDE 0.9 % IV SOLN
250.0000 mL | INTRAVENOUS | Status: DC | PRN
Start: 2016-02-14 — End: 2016-02-14

## 2016-02-14 MED ORDER — MEPERIDINE HCL 50 MG/ML IJ SOLN: 6.2500 mg | INTRAMUSCULAR | Status: DC | PRN

## 2016-02-14 MED ORDER — LIDOCAINE HCL (CARDIAC) 20 MG/ML IV SOLN
INTRAVENOUS | Status: DC | PRN
  Administered 2016-02-14: 50 mg via INTRAVENOUS

## 2016-02-14 MED ORDER — MIDAZOLAM HCL 5 MG/5ML IJ SOLN
INTRAMUSCULAR | Status: DC | PRN
Start: 2016-02-14 — End: 2016-02-14
  Administered 2016-02-14: 2 mg via INTRAVENOUS

## 2016-02-14 MED ORDER — DEXAMETHASONE SODIUM PHOSPHATE 10 MG/ML IJ SOLN
INTRAMUSCULAR | Status: AC
  Filled 2016-02-14: qty 1

## 2016-02-14 MED ORDER — LACTATED RINGERS IV SOLN: INTRAVENOUS | Status: DC

## 2016-02-14 MED ORDER — EPHEDRINE SULFATE 50 MG/ML IJ SOLN
INTRAMUSCULAR | Status: AC
  Filled 2016-02-14: qty 1

## 2016-02-14 MED ORDER — FENTANYL CITRATE (PF) 100 MCG/2ML IJ SOLN
INTRAMUSCULAR | Status: DC | PRN
  Administered 2016-02-14: 100 ug via INTRAVENOUS
  Administered 2016-02-14: 25 ug via INTRAVENOUS

## 2016-02-14 MED ORDER — ROCURONIUM BROMIDE 50 MG/5ML IV SOLN
INTRAVENOUS | Status: AC
  Filled 2016-02-14: qty 1

## 2016-02-14 MED ORDER — MORPHINE SULFATE (PF) 10 MG/ML IV SOLN: 2.0000 mg | INTRAVENOUS | Status: DC | PRN

## 2016-02-14 MED ORDER — SODIUM CHLORIDE 0.9% FLUSH: 3.0000 mL | INTRAVENOUS | Status: DC | PRN

## 2016-02-14 MED ORDER — ACETAMINOPHEN 10 MG/ML IV SOLN: INTRAVENOUS | Status: DC | PRN

## 2016-02-14 MED ORDER — ACETAMINOPHEN 10 MG/ML IV SOLN
1000.0000 mg | Freq: Once | INTRAVENOUS | Status: AC
Start: 2016-02-14 — End: 2016-02-14
  Administered 2016-02-14: 1000 mg via INTRAVENOUS

## 2016-02-14 MED ORDER — PROPOFOL 10 MG/ML IV BOLUS
INTRAVENOUS | Status: DC | PRN
  Administered 2016-02-14: 150 mg via INTRAVENOUS

## 2016-02-14 MED ORDER — LACTATED RINGERS IV SOLN
INTRAVENOUS | Status: DC
  Administered 2016-02-14: 1000 mL via INTRAVENOUS
  Administered 2016-02-14: 11:00:00 via INTRAVENOUS

## 2016-02-14 MED ORDER — LIDOCAINE HCL (CARDIAC) 20 MG/ML IV SOLN
INTRAVENOUS | Status: AC
  Filled 2016-02-14: qty 5

## 2016-02-14 MED ORDER — OXYCODONE HCL 5 MG PO TABS: 5.0000 mg | ORAL_TABLET | ORAL | Status: DC | PRN

## 2016-02-14 MED ORDER — PHENYLEPHRINE 40 MCG/ML (10ML) SYRINGE FOR IV PUSH (FOR BLOOD PRESSURE SUPPORT)
PREFILLED_SYRINGE | INTRAVENOUS | Status: AC
  Filled 2016-02-14: qty 10

## 2016-02-14 MED ORDER — BUPIVACAINE-EPINEPHRINE 0.25% -1:200000 IJ SOLN
INTRAMUSCULAR | Status: AC
  Filled 2016-02-14: qty 1

## 2016-02-14 MED ORDER — SODIUM CHLORIDE 0.9% FLUSH: 3.0000 mL | Freq: Two times a day (BID) | INTRAVENOUS | Status: DC

## 2016-02-14 SURGICAL SUPPLY — 40 items
APPLIER CLIP 5 13 M/L LIGAMAX5 (MISCELLANEOUS) ×3
BAG URINE DRAINAGE (UROLOGICAL SUPPLIES) ×3 IMPLANT
BENZOIN TINCTURE PRP APPL 2/3 (GAUZE/BANDAGES/DRESSINGS) ×3 IMPLANT
CABLE HIGH FREQUENCY MONO STRZ (ELECTRODE) IMPLANT
CATH FOLEY 3WAY 30CC 16FR (CATHETERS) ×3 IMPLANT
CHLORAPREP W/TINT 26ML (MISCELLANEOUS) ×3 IMPLANT
CLIP APPLIE 5 13 M/L LIGAMAX5 (MISCELLANEOUS) ×1 IMPLANT
CLOSURE WOUND 1/2 X4 (GAUZE/BANDAGES/DRESSINGS) ×1
COVER SURGICAL LIGHT HANDLE (MISCELLANEOUS) ×3 IMPLANT
DECANTER SPIKE VIAL GLASS SM (MISCELLANEOUS) ×3 IMPLANT
ELECT REM PT RETURN 9FT ADLT (ELECTROSURGICAL) ×3
ELECTRODE REM PT RTRN 9FT ADLT (ELECTROSURGICAL) ×1 IMPLANT
ENDOLOOP SUT PDS II  0 18 (SUTURE) ×2
ENDOLOOP SUT PDS II 0 18 (SUTURE) ×1 IMPLANT
GAUZE SPONGE 2X2 8PLY STRL LF (GAUZE/BANDAGES/DRESSINGS) ×1 IMPLANT
GLOVE BIO SURGEON STRL SZ7.5 (GLOVE) ×3 IMPLANT
GOWN STRL REUS W/TWL XL LVL3 (GOWN DISPOSABLE) ×6 IMPLANT
KIT BASIN OR (CUSTOM PROCEDURE TRAY) ×3 IMPLANT
MESH 3DMAX 4X6 LT LRG (Mesh General) ×3 IMPLANT
NEEDLE INSUFFLATION 14GA 120MM (NEEDLE) IMPLANT
PAD POSITIONING PINK XL (MISCELLANEOUS) IMPLANT
PLUG CATH AND CAP STER (CATHETERS) ×3 IMPLANT
POSITIONER SURGICAL ARM (MISCELLANEOUS) IMPLANT
RELOAD STAPLE HERNIA 4.0 BLUE (INSTRUMENTS) ×3 IMPLANT
RELOAD STAPLE HERNIA 4.8 BLK (STAPLE) ×3 IMPLANT
SCISSORS LAP 5X35 DISP (ENDOMECHANICALS) IMPLANT
SET IRRIG TUBING LAPAROSCOPIC (IRRIGATION / IRRIGATOR) IMPLANT
SET IRRIG Y TYPE TUR BLADDER L (SET/KITS/TRAYS/PACK) ×3 IMPLANT
SPONGE GAUZE 2X2 STER 10/PKG (GAUZE/BANDAGES/DRESSINGS) ×2
STAPLER HERNIA 12 8.5 360D (INSTRUMENTS) ×3 IMPLANT
STRIP CLOSURE SKIN 1/2X4 (GAUZE/BANDAGES/DRESSINGS) ×2 IMPLANT
SUT MNCRL AB 4-0 PS2 18 (SUTURE) ×3 IMPLANT
TAPE CLOTH 4X10 WHT NS (GAUZE/BANDAGES/DRESSINGS) ×3 IMPLANT
TOWEL OR 17X26 10 PK STRL BLUE (TOWEL DISPOSABLE) ×3 IMPLANT
TOWEL OR NON WOVEN STRL DISP B (DISPOSABLE) ×3 IMPLANT
TRAY FOLEY W/METER SILVER 14FR (SET/KITS/TRAYS/PACK) IMPLANT
TRAY FOLEY W/METER SILVER 16FR (SET/KITS/TRAYS/PACK) ×3 IMPLANT
TRAY LAPAROSCOPIC (CUSTOM PROCEDURE TRAY) ×3 IMPLANT
TROCAR CANNULA W/PORT DUAL 5MM (MISCELLANEOUS) ×3 IMPLANT
TROCAR XCEL 12X100 BLDLESS (ENDOMECHANICALS) ×3 IMPLANT

## 2016-02-14 NOTE — Transfer of Care (Signed)
Immediate Anesthesia Transfer of Care Note  Patient: Juan [name]  Procedure(s) Performed: Procedure(s): LAPAROSCOPIC LEFT  INGUINAL HERNIA (Left) INSERTION OF MESH (Left)  Patient Location: PACU  Anesthesia Type:General  Level of Consciousness: awake and patient cooperative  Airway & Oxygen Therapy: Patient Spontanous Breathing and Patient connected to face mask oxygen  Post-op Assessment: Report given to RN and Post -op Vital signs reviewed and stable  Post vital signs: Reviewed and stable  Last Vitals:  Filed Vitals:   02/14/16 0808 02/14/16 1119  BP: 103/66   Pulse: 56 72  Temp: 36.5 C 36.5 C  Resp: 18 12    Last Pain: There were no vitals filed for this visit.    Patients Stated Pain Goal: 4 ([id number])  Complications: No apparent anesthesia complications

## 2016-02-14 NOTE — H&P (Signed)
History of Present Illness Ralene Ok MD; 01/09/2016 10:02 AM) Patient words: hernia.  The patient is a 55 year old male who presents with an inguinal hernia. The patient is a 55 year old male who is referred by Dr. London Pepper for evaluation of a left inguinal hernia. Patient states she's had this hernia there for 4-5 years. He states got more bothersome and slightly increased in size.  Of note patient states he had a left inguinal hernia at the age of 79.  Patient has had a CABG in the past. He currently sees Dr. Marlou Porch as his cardiologist.   Other Problems (Ammie Eversole, LPN; 579FGE 075-GRM AM) Hypercholesterolemia Inguinal Hernia Kidney Stone Other disease, cancer, significant illness  Past Surgical History Aleatha Borer, LPN; 579FGE 075-GRM AM) Coronary Artery Bypass Graft Open Inguinal Hernia Surgery Right. Vasectomy  Diagnostic Studies History (Ammie Eversole, LPN; 579FGE 075-GRM AM) Colonoscopy never  Allergies (Ammie Eversole, LPN; 579FGE D34-534 AM) No Known Drug Allergies03/28/2017  Medication History (Ammie Eversole, LPN; 579FGE D34-534 AM) Atorvastatin Calcium (40MG  Tablet, Oral) Active. Metoprolol Tartrate (50MG  Tablet, Oral) Active. Aspirin (81MG  Tablet Chewable, Oral) Active. Naproxen Sodium (220MG  Capsule, Oral) Active. Medications Reconciled  Social History (Ammie Eversole, LPN; 579FGE 075-GRM AM) Caffeine use Carbonated beverages, Coffee, Tea. No alcohol use No drug use Tobacco use Never smoker.    Review of Systems (Ammie Eversole LPN; 579FGE 075-GRM AM) General Not Present- Appetite Loss, Chills, Fatigue, Fever, Night Sweats, Weight Gain and Weight Loss. Skin Not Present- Change in Wart/Mole, Dryness, Hives, Jaundice, New Lesions, Non-Healing Wounds, Rash and Ulcer. HEENT Not Present- Earache, Hearing Loss, Hoarseness, Nose Bleed, Oral Ulcers, Ringing in the Ears, Seasonal Allergies, Sinus Pain, Sore Throat, Visual  Disturbances, Wears glasses/contact lenses and Yellow Eyes. Respiratory Not Present- Bloody sputum, Chronic Cough, Difficulty Breathing, Snoring and Wheezing. Breast Not Present- Breast Mass, Breast Pain, Nipple Discharge and Skin Changes. Cardiovascular Not Present- Chest Pain, Difficulty Breathing Lying Down, Leg Cramps, Palpitations, Rapid Heart Rate, Shortness of Breath and Swelling of Extremities. Gastrointestinal Not Present- Abdominal Pain, Bloating, Bloody Stool, Change in Bowel Habits, Chronic diarrhea, Constipation, Difficulty Swallowing, Excessive gas, Gets full quickly at meals, Hemorrhoids, Indigestion, Nausea, Rectal Pain and Vomiting. Male Genitourinary Not Present- Blood in Urine, Change in Urinary Stream, Frequency, Impotence, Nocturia, Painful Urination, Urgency and Urine Leakage. Musculoskeletal Not Present- Back Pain, Joint Pain, Joint Stiffness, Muscle Pain, Muscle Weakness and Swelling of Extremities. Neurological Not Present- Decreased Memory, Fainting, Headaches, Numbness, Seizures, Tingling, Tremor, Trouble walking and Weakness. Psychiatric Not Present- Anxiety, Bipolar, Change in Sleep Pattern, Depression, Fearful and Frequent crying. Endocrine Not Present- Cold Intolerance, Excessive Hunger, Hair Changes, Heat Intolerance, Hot flashes and New Diabetes. Hematology Not Present- Easy Bruising, Excessive bleeding, Gland problems, HIV and Persistent Infections. BP 103/66 mmHg  Pulse 56  Temp(Src) 97.7 F (36.5 C) (Oral)  Resp 18  Ht 5' 5.75" (1.67 m)  Wt 91.173 kg (201 lb)  BMI 32.69 kg/m2  SpO2 100%   Physical Exam Ralene Ok MD; 01/09/2016 10:02 AM) General Mental Status-Alert. General Appearance-Consistent with stated age. Hydration-Well hydrated. Voice-Normal.  Head and Neck Head-normocephalic, atraumatic with no lesions or palpable masses. Trachea-midline.  Eye Eyeball - Bilateral-Extraocular movements intact. Sclera/Conjunctiva -  Bilateral-No scleral icterus.  Chest and Lung Exam Chest and lung exam reveals -quiet, even and easy respiratory effort with no use of accessory muscles. Inspection Chest Wall - Normal. Back - normal.  Cardiovascular Cardiovascular examination reveals -normal heart sounds, regular rate and rhythm with no murmurs.  Abdomen Inspection  Hernias - Inguinal hernia - Left - Reducible. Palpation/Percussion Normal exam - Soft, Non Tender, No Rebound tenderness, No Rigidity (guarding) and No hepatosplenomegaly. Auscultation Normal exam - Bowel sounds normal.  Neurologic Neurologic evaluation reveals -alert and oriented x 3 with no impairment of recent or remote memory. Mental Status-Normal.  Musculoskeletal Normal Exam - Left-Upper Extremity Strength Normal and Lower Extremity Strength Normal. Normal Exam - Right-Upper Extremity Strength Normal, Lower Extremity Weakness.    Assessment & Plan Ralene Ok MD; 01/09/2016 10:03 AM) LEFT INGUINAL HERNIA (K40.90) Impression: 55 year old male with a left inguinal hernia.  1. The patient will like to proceed to the operating room for laparoscopic LEFT inguinal hernia repair with mesh.  2. I discussed with the patient the signs and symptoms of incarceration and strangulation and the need to proceed to the ER should they occur.  3. I discussed with the patient the risks and benefits of the procedure to include but not limited to: Infection, bleeding, damage to surrounding structures, possible need for further surgery, possible nerve pain, and possible recurrence. The patient was understanding and wishes to proceed.

## 2016-02-14 NOTE — Anesthesia Postprocedure Evaluation (Signed)
Anesthesia Post Note  Patient: Juan [name]  Procedure(s) Performed: Procedure(s) (LRB): LAPAROSCOPIC LEFT  INGUINAL HERNIA (Left) INSERTION OF MESH (Left)  Patient location during evaluation: PACU Anesthesia Type: General Level of consciousness: awake and alert Pain management: pain level controlled Vital Signs Assessment: post-procedure vital signs reviewed and stable Respiratory status: spontaneous breathing, nonlabored ventilation, respiratory function stable and patient connected to nasal cannula oxygen Cardiovascular status: blood pressure returned to baseline and stable Postop Assessment: no signs of nausea or vomiting Anesthetic complications: no    Last Vitals:  Filed Vitals:   02/14/16 1212 02/14/16 1401  BP: 108/71 110/75  Pulse: 65 66  Temp: 36.4 C 36.5 C  Resp: 15 16    Last Pain:  Filed Vitals:   02/14/16 1403  PainSc: 1                  Montez Hageman

## 2016-02-14 NOTE — Anesthesia Preprocedure Evaluation (Signed)
Anesthesia Evaluation  Patient identified by MRN, date of birth, ID band Patient awake    Reviewed: Allergy & Precautions, NPO status , Patient's Chart, lab work & pertinent test results  Airway Mallampati: II  TM Distance: >3 FB Neck ROM: Full    Dental no notable dental hx.    Pulmonary neg pulmonary ROS,    Pulmonary exam normal breath sounds clear to auscultation       Cardiovascular hypertension, Pt. on medications + CAD and + CABG (2014)  negative cardio ROS Normal cardiovascular exam Rhythm:Regular Rate:Normal     Neuro/Psych negative neurological ROS  negative psych ROS   GI/Hepatic negative GI ROS, Neg liver ROS,   Endo/Other  negative endocrine ROS  Renal/GU negative Renal ROS  negative genitourinary   Musculoskeletal negative musculoskeletal ROS (+)   Abdominal   Peds negative pediatric ROS (+)  Hematology negative hematology ROS (+)   Anesthesia Other Findings   Reproductive/Obstetrics negative OB ROS                             Anesthesia Physical Anesthesia Plan  ASA: III  Anesthesia Plan: General   Post-op Pain Management:    Induction: Intravenous  Airway Management Planned: Oral ETT  Additional Equipment:   Intra-op Plan:   Post-operative Plan: Extubation in OR  Informed Consent: I have reviewed the patients History and Physical, chart, labs and discussed the procedure including the risks, benefits and alternatives for the proposed anesthesia with the patient or authorized representative who has indicated his/her understanding and acceptance.   Dental advisory given  Plan Discussed with: CRNA  Anesthesia Plan Comments:         Anesthesia Quick Evaluation

## 2016-02-14 NOTE — Anesthesia Procedure Notes (Signed)
Procedure Name: Intubation Date/Time: 02/14/2016 10:11 AM Performed by: Deliah Boston Pre-anesthesia Checklist: Patient identified, Emergency Drugs available, Suction available and Patient being monitored Patient Re-evaluated:Patient Re-evaluated prior to inductionOxygen Delivery Method: Circle System Utilized Preoxygenation: Pre-oxygenation with 100% oxygen Intubation Type: IV induction Ventilation: Mask ventilation without difficulty Laryngoscope Size: Miller and 2 Grade View: Grade I Tube type: Oral Tube size: 7.5 mm Number of attempts: 1 Airway Equipment and Method: Oral airway Placement Confirmation: ETT inserted through vocal cords under direct vision,  positive ETCO2 and breath sounds checked- equal and bilateral Secured at: 21 cm Tube secured with: Tape Dental Injury: Teeth and Oropharynx as per pre-operative assessment

## 2016-02-14 NOTE — Discharge Instructions (Signed)
[facility] _______[facility] ° °INGUINAL HERNIA REPAIR: POST OP INSTRUCTIONS ° °Always review your discharge instruction sheet given to you by the facility where your surgery was performed. °IF YOU HAVE DISABILITY OR FAMILY LEAVE FORMS, YOU MUST BRING THEM TO THE OFFICE FOR PROCESSING.   °DO NOT GIVE THEM TO YOUR DOCTOR. ° °1. A  prescription for pain medication may be given to you upon discharge.  Take your pain medication as prescribed, if needed.  If narcotic pain medicine is not needed, then you may take acetaminophen (Tylenol) or ibuprofen (Advil) as needed. °2. Take your usually prescribed medications unless otherwise directed. °3. If you need a refill on your pain medication, please contact your pharmacy.  They will contact our office to request authorization. Prescriptions will not be filled after 5 pm or on week-ends. °4. You should follow a light diet the first 24 hours after arrival home, such as soup and crackers, etc.  Be sure to include lots of fluids daily.  Resume your normal diet the day after surgery. °5. Most patients will experience some swelling and bruising around the umbilicus or in the groin and scrotum.  Ice packs and reclining will help.  Swelling and bruising can take several days to resolve.  °6. It is common to experience some constipation if taking pain medication after surgery.  Increasing fluid intake and taking a stool softener (such as Colace) will usually help or prevent this problem from occurring.  A mild laxative (Milk of Magnesia or Miralax) should be taken according to package directions if there are no bowel movements after 48 hours. °7. Unless discharge instructions indicate otherwise, you may remove your bandages 24-48 hours after surgery, and you may shower at that time.  You may have steri-strips (small skin tapes) in place directly over the incision.  These strips should be left on the skin for 7-10 days.  If your surgeon used skin glue on the incision, you  may shower in 24 hours.  The glue will flake off over the next 2-3 weeks.  Any sutures or staples will be removed at the office during your follow-up visit. °8. ACTIVITIES:  You may resume regular (light) daily activities beginning the next day--such as daily self-care, walking, climbing stairs--gradually increasing activities as tolerated.  You may have sexual intercourse when it is comfortable.  Refrain from any heavy lifting or straining until approved by your doctor. °a. You may drive when you are no longer taking prescription pain medication, you can comfortably wear a seatbelt, and you can safely maneuver your car and apply brakes. °b. RETURN TO WORK:  __________________________________________________________ °9. You should see your doctor in the office for a follow-up appointment approximately 2-3 weeks after your surgery.  Make sure that you call for this appointment within a day or two after you arrive home to insure a convenient appointment time. °10. OTHER INSTRUCTIONS:  __________________________________________________________________________________________________________________________________________________________________________________________  °WHEN TO CALL YOUR DOCTOR: °1. Fever over 101.0 °2. Inability to urinate °3. Nausea and/or vomiting °4. Extreme swelling or bruising °5. Continued bleeding from incision. °6. Increased pain, redness, or drainage from the incision ° °The clinic staff is available to answer your questions during regular business hours.  Please don’t hesitate to call and ask to speak to one of the nurses for clinical concerns.  If you have a medical emergency, go to the nearest emergency room or call 911.  A surgeon from [facility] is always on call at the hospital ° ° °[address], [address], [place], [place]  [address] ? ° [address] [address], [place], [place]   [address] °[phone] ? [phone] ? FAX [phone] °Web site:  [email or web address] ° ° ° ° °General Anesthesia, Adult, Care After °Refer to this sheet in the next few weeks. These instructions provide you with information on caring for yourself after your procedure. Your health care provider may also give you more specific instructions. Your treatment has been planned according to current medical practices, but problems sometimes occur. Call your health care provider if you have any problems or questions after your procedure. °WHAT TO EXPECT AFTER THE PROCEDURE °After the procedure, it is typical to experience: °· Sleepiness. °· Nausea and vomiting. °HOME CARE INSTRUCTIONS °· For the first 24 hours after general anesthesia: °¨ Have a responsible person with you. °¨ Do not drive a car. If you are alone, do not take public transportation. °¨ Do not drink alcohol. °¨ Do not take medicine that has not been prescribed by your health care provider. °¨ Do not sign important papers or make important decisions. °¨ You may resume a normal diet and activities as directed by your health care provider. °· Change bandages (dressings) as directed. °· If you have questions or problems that seem related to general anesthesia, call the hospital and ask for the anesthetist or anesthesiologist on call. °SEEK MEDICAL CARE IF: °· You have nausea and vomiting that continue the day after anesthesia. °· You develop a rash. °SEEK IMMEDIATE MEDICAL CARE IF:  °· You have difficulty breathing. °· You have chest pain. °· You have any allergic problems. °  °This information is not intended to replace advice given to you by your health care provider. Make sure you discuss any questions you have with your health care provider. °  °Document Released: 01/06/2001 Document Revised: 10/21/2014 Document Reviewed: 01/29/2012 °Elsevier Interactive Patient Education ©2016 Elsevier Inc. ° °

## 2016-02-14 NOTE — Op Note (Signed)
02/14/2016  11:01 AM  PATIENT:  Juan [name]  55 y.o. male  PRE-OPERATIVE DIAGNOSIS:  left inguinal hernia  POST-OPERATIVE DIAGNOSIS:  left direct inguinal hernia, left cord lipoma  PROCEDURE:  Procedure(s): LAPAROSCOPIC LEFT  INGUINAL HERNIA (Left) INSERTION OF MESH (Left)  SURGEON:  Surgeon(s) and Role:    * Ralene Ok, MD - Primary  ANESTHESIA:   local and general  EBL: 5cc Total I/O In: -  Out: 500 [Urine:500]  BLOOD ADMINISTERED:none  DRAINS: none   LOCAL MEDICATIONS USED:  BUPIVICAINE   SPECIMEN:  No Specimen  DISPOSITION OF SPECIMEN:  N/A  COUNTS:  YES  TOURNIQUET:  * No tourniquets in log *  DICTATION: .Dragon Dictation   Counts: reported as correct x 2  Findings:  The patient had a small left direct hernia and moderated sized left cord lipoma  Indications for procedure:  The patient is a 55 year old male with a left hernia for several months. Patient complained of symptomatology to his left inguinal area. The patient was taken back for elective inguinal hernia repair.  Details of the procedure: The patient was taken back to the operating room. The patient was placed in supine position with bilateral SCDs in place.  The patient was prepped and draped in the usual sterile fashion.  After appropriate anitbiotics were confirmed, a time-out was confirmed and all facts were verified.  0.25% Marcaine was used to infiltrate the umbilical area. A 11-blade was used to cut down the skin and blunt dissection was used to get the anterior fashion.  The anterior fascia was incised approximately 1 cm and the muscles were retracted laterally. Blunt dissection was then used to create a space in the preperitoneal area. At this time a 10 mm camera was then introduced into the space and advanced the pubic tubercle and a 12 mm trocar was placed over this and insufflation was started.  At this time and space was created from medial to laterally the preperitoneal space.   Cooper's ligament was initially cleaned off.  The hernia sac was identified in the direct space.  The transversalis fascia was dissected away from the surrounding structure.  It retracted spontaneously.   Dissection of the spermatic cord was undertaken the vas deferens was identified and protected in all parts of the case. A moderate sized cord lipoma was dissected from the inguinal canal.  At this time a Bard 3D Max mesh, size: Large, was  introduced into the preperitoneal space.  The mesh was brought over to cover the direct and indirect hernia spaces.  This was anchored into place and secured to Cooper's ligament with 4.86mm staples from a Coviden hernia stapler. It was anchored to the anterior abdominal wall with 4.8 mm staples. The hernia sac was seen lying posterior to the mesh. There was no staples placed laterally. The insufflation was evacuated and the peritoneum was seen posterior to the mesh. The trochars were removed. The anterior fascia was reapproximated using #1 Vicryl on a UR- 6.  Intra-abdominal air was evacuated and the Veress needle removed. The skin was reapproximated using 4-0 Monocryl subcuticular fashion the patient was awakened from general anesthesia and taken to recovery in stable condition.   PLAN OF CARE: Discharge to home after PACU  PATIENT DISPOSITION:  PACU - hemodynamically stable.   Delay start of Pharmacological VTE agent (>24hrs) due to surgical blood loss or risk of bleeding: not applicable

## 2016-02-15 ENCOUNTER — Encounter (HOSPITAL_COMMUNITY): Payer: Self-pay | Admitting: General Surgery

## 2016-04-09 ENCOUNTER — Other Ambulatory Visit: Payer: Self-pay | Admitting: Cardiology

## 2016-06-08 ENCOUNTER — Other Ambulatory Visit: Payer: Self-pay | Admitting: Cardiology

## 2017-01-04 ENCOUNTER — Other Ambulatory Visit: Payer: Self-pay | Admitting: Cardiology

## 2017-01-08 ENCOUNTER — Encounter: Payer: Self-pay | Admitting: Cardiology

## 2017-01-20 ENCOUNTER — Ambulatory Visit (INDEPENDENT_AMBULATORY_CARE_PROVIDER_SITE_OTHER): Payer: Commercial Managed Care - HMO | Admitting: Cardiology

## 2017-01-20 ENCOUNTER — Encounter: Payer: Self-pay | Admitting: Cardiology

## 2017-01-20 VITALS — BP 110/78 | HR 58 | Ht 65.75 in | Wt 196.6 lb

## 2017-01-20 DIAGNOSIS — I2510 Atherosclerotic heart disease of native coronary artery without angina pectoris: Secondary | ICD-10-CM

## 2017-01-20 DIAGNOSIS — Z951 Presence of aortocoronary bypass graft: Secondary | ICD-10-CM | POA: Diagnosis not present

## 2017-01-20 DIAGNOSIS — E7800 Pure hypercholesterolemia, unspecified: Secondary | ICD-10-CM

## 2017-01-20 DIAGNOSIS — I10 Essential (primary) hypertension: Secondary | ICD-10-CM

## 2017-01-20 NOTE — Progress Notes (Signed)
[name] [address], [address] [place], [place]  [address] Phone: [phone] Fax:  [phone]  Date:  01/20/2017   ID:  Juan [name], DOB 1961/09/02, MRN [id number]  PCP:  London Pepper, MD   History of Present Illness: Juan [name] is a 56 y.o. male with severe multivessel coronary artery disease status post bypass 07/17/13, LIMA to LAD, free RIMA to obtuse marginal, SVG to PDA and posterolateral with ejection fraction of approximately 50% with mid inferior wall akinesis here for followup.  His symptoms began with typical anginal symptoms, high risk exercise treadmill test with 2 mm of ST segment depression diffusely Anginal symptoms. Nonsmoker. No early family history of CAD. Nondiabetic.  Overall he has been doing very well, no chest pain, no shortness of breath, no syncope, no bleeding, no orthopnea. Works for [employer]. Been compliant with his medication see below for details.    Wt Readings from Last 3 Encounters:  01/20/17 196 lb 9.6 oz (89.2 kg)  02/14/16 201 lb (91.2 kg)  02/07/16 201 lb (91.2 kg)     Past Medical History:  Diagnosis Date  . CAD (coronary artery disease)   . Complication of anesthesia    SLOW TO WAKE UP  . Family history of anesthesia complication    Mother had problems with BP dropping after surgery  . Hernia, inguinal   . Kidney stones   . Pure hypercholesterolemia     Past Surgical History:  Procedure Laterality Date  . CARDIAC CATHETERIZATION    . CORONARY ARTERY BYPASS GRAFT N/A 07/16/2013   Procedure: CORONARY ARTERY BYPASS GRAFTING times using bilateral internal mammary arteries and right leg saphenous vein harvested endoscopically.;  Surgeon: Melrose Nakayama, MD;  Location: [facility];  Service: Open Heart Surgery;  Laterality: N/A;  BIMA, EVH 4 VESSELS  . FRACTIONAL FLOW RESERVE WIRE  07/09/2013   Procedure: FRACTIONAL FLOW RESERVE WIRE;  Surgeon: Candee Furbish, MD;  Location: [facility] CATH LAB;  Service: Cardiovascular;;  .  HERNIA REPAIR     1963  . INGUINAL HERNIA REPAIR Left 02/14/2016   Procedure: LAPAROSCOPIC LEFT  INGUINAL HERNIA;  Surgeon: Ralene Ok, MD;  Location: [facility];  Service: General;  Laterality: Left;  . INSERTION OF MESH Left 02/14/2016   Procedure: INSERTION OF MESH;  Surgeon: Ralene Ok, MD;  Location: [facility];  Service: General;  Laterality: Left;  . LEFT HEART CATHETERIZATION WITH CORONARY ANGIOGRAM N/A 07/09/2013   Procedure: LEFT HEART CATHETERIZATION WITH CORONARY ANGIOGRAM;  Surgeon: Candee Furbish, MD;  Location: [facility] CATH LAB;  Service: Cardiovascular;  Laterality: N/A;  . vasectomy      Current Outpatient Prescriptions  Medication Sig Dispense Refill  . aspirin EC 81 MG tablet Take 81 mg by mouth daily.    Marland Kitchen atorvastatin (LIPITOR) 40 MG tablet TAKE 1 TABLET BY MOUTH EVERY DAY 30 tablet 0  . fexofenadine (ALLEGRA) 180 MG tablet Take 180 mg by mouth daily as needed for allergies.    . metoprolol (LOPRESSOR) 50 MG tablet TAKE 1 TABLET BY MOUTH TWICE A DAY 60 tablet 7   No current facility-administered medications for this visit.     Allergies:   No Known Allergies  Social History:  The patient  reports that he has never smoked. He has never used smokeless tobacco. He reports that he does not drink alcohol or use drugs.   ROS:  Please see the history of present illness.   No syncope, no bleeding, no orthopnea.  Right arm was bothering him when picking up objects but this has resolved.   All other systems reviewed and negative.   PHYSICAL EXAM: VS:  BP 110/78 (BP Location: Left Arm, Patient Position: Sitting, Cuff Size: Normal)   Pulse (!) 58   Ht 5' 5.75" (1.67 m)   Wt 196 lb 9.6 oz (89.2 kg)   SpO2 94%   BMI 31.97 kg/m  Well nourished, well developed, in no acute distress HEENT: normal Neck: no JVD Cardiac:  normal S1, S2; RRR; no murmurHealed sternal wound. Lungs:  clear to auscultation bilaterally, no wheezing, rhonchi or rales Abd: soft, nontender, no hepatomegaly Ext:  no edema Skin: warm and dry Neuro: no focal abnormalities noted   EKG: EKG today 01/20/17 shows sinus bradycardia rate 58 with small inferior infarct pattern, small nonpathologic Q waves in 2, aVF. Personally viewed-prior 05/16/2015-normal sinus rhythm, inferior Q waves. No change, personally viewed 10/04/13-sinus rhythm, 73, old inferior infarct pattern.  ASSESSMENT AND PLAN:  Coronary artery disease status post bypass in 2014.  - Doing very well. No anginal symptoms, no shortness of breath no chest pain. He has been doing more and more pushups every day. Aspirin, beta blocker, statin. -Worked in [employer]. Also works as a Cabin crew.   Hyperlipidemia-Prior LDL was 153. Now 21. Great. He is currently on atorvastatin 40 mg.  Continue with diet, exercise. No myalgias. Doing well. Stable.  Obesity -good job with weight loss. Keep up the weight loss. Doing very well.  We will see back in 2 year. He has been doing very well. Stable.  Signed, Candee Furbish, MD [facility]  01/20/2017 10:18 AM

## 2017-01-20 NOTE — Patient Instructions (Signed)
Medication Instructions:  The current medical regimen is effective;  continue present plan and medications.  Follow-Up: Follow up in 2 years with Dr. Skains.  You will receive a letter in the mail 2 months before you are due.  Please call us when you receive this letter to schedule your follow up appointment.  If you need a refill on your cardiac medications before your next appointment, please call your pharmacy.  Thank you for choosing [facility]!!     

## 2017-02-01 ENCOUNTER — Other Ambulatory Visit: Payer: Self-pay | Admitting: Cardiology

## 2017-06-24 DIAGNOSIS — Z23 Encounter for immunization: Secondary | ICD-10-CM | POA: Diagnosis not present

## 2017-06-24 DIAGNOSIS — I2510 Atherosclerotic heart disease of native coronary artery without angina pectoris: Secondary | ICD-10-CM | POA: Diagnosis not present

## 2017-06-24 DIAGNOSIS — E785 Hyperlipidemia, unspecified: Secondary | ICD-10-CM | POA: Diagnosis not present

## 2017-12-01 ENCOUNTER — Other Ambulatory Visit: Payer: Self-pay | Admitting: Cardiology

## 2018-01-07 ENCOUNTER — Other Ambulatory Visit: Payer: Self-pay | Admitting: Cardiology

## 2018-01-07 MED ORDER — METOPROLOL TARTRATE 50 MG PO TABS: 50.0000 mg | ORAL_TABLET | Freq: Two times a day (BID) | ORAL | 0 refills | Status: DC

## 2018-01-11 ENCOUNTER — Other Ambulatory Visit: Payer: Self-pay | Admitting: Cardiology

## 2018-02-17 ENCOUNTER — Other Ambulatory Visit: Payer: Self-pay | Admitting: Cardiology

## 2018-03-02 ENCOUNTER — Other Ambulatory Visit: Payer: Self-pay | Admitting: Cardiology

## 2018-10-04 ENCOUNTER — Other Ambulatory Visit: Payer: Self-pay | Admitting: Cardiology

## 2018-10-19 DIAGNOSIS — Z23 Encounter for immunization: Secondary | ICD-10-CM | POA: Diagnosis not present

## 2018-10-19 DIAGNOSIS — I2510 Atherosclerotic heart disease of native coronary artery without angina pectoris: Secondary | ICD-10-CM | POA: Diagnosis not present

## 2018-10-19 DIAGNOSIS — E785 Hyperlipidemia, unspecified: Secondary | ICD-10-CM | POA: Diagnosis not present

## 2019-01-28 ENCOUNTER — Telehealth: Payer: Self-pay | Admitting: Cardiology

## 2019-01-28 ENCOUNTER — Encounter: Payer: Self-pay | Admitting: Cardiology

## 2019-01-28 ENCOUNTER — Telehealth (INDEPENDENT_AMBULATORY_CARE_PROVIDER_SITE_OTHER): Payer: Self-pay | Admitting: Cardiology

## 2019-01-28 ENCOUNTER — Other Ambulatory Visit: Payer: Self-pay

## 2019-01-28 VITALS — Ht 65.75 in | Wt 179.0 lb

## 2019-01-28 DIAGNOSIS — E785 Hyperlipidemia, unspecified: Secondary | ICD-10-CM

## 2019-01-28 DIAGNOSIS — Z951 Presence of aortocoronary bypass graft: Secondary | ICD-10-CM

## 2019-01-28 DIAGNOSIS — E7800 Pure hypercholesterolemia, unspecified: Secondary | ICD-10-CM

## 2019-01-28 DIAGNOSIS — E669 Obesity, unspecified: Secondary | ICD-10-CM

## 2019-01-28 DIAGNOSIS — I2510 Atherosclerotic heart disease of native coronary artery without angina pectoris: Secondary | ICD-10-CM

## 2019-01-28 NOTE — Telephone Encounter (Signed)
Encounter not needed

## 2019-01-28 NOTE — Progress Notes (Signed)
Virtual Visit via Video Note   This visit type was conducted due to national recommendations for restrictions regarding the COVID-19 Pandemic (e.g. social distancing) in an effort to limit this patient's exposure and mitigate transmission in our community.  Due to his co-morbid illnesses, this patient is at least at moderate risk for complications without adequate follow up.  This format is felt to be most appropriate for this patient at this time.  All issues noted in this document were discussed and addressed.  A limited physical exam was performed with this format.  Please refer to the patient's chart for his consent to telehealth for [facility].   Evaluation Performed:  Follow-up visit  Date:  01/28/2019   ID:  Juan [name], DOB May 16, 1961, MRN [id number]  Patient Location: Home Provider Location: Home  PCP:  London Pepper, MD  Cardiologist:  Candee Furbish, MD  Electrophysiologist:  None   Chief Complaint: Here for follow-up of coronary artery disease status post CABG 2014  History of Present Illness:    Juan [name] is a 58 y.o. male with with coronary artery disease status post CABG July 17, 2013 with LIMA to LAD, free RIMA to obtuse marginal, SVG to PDA and posterior lateral branch with a EF of approximately 50% with mid inferior wall akinesis.  Previous symptoms were typical angina with exercise had a high risk exercise treadmill test with 2 mm of ST segment depression.  He is a non-smoker no early family history of CAD in a nondiabetic.  Works as a Cabin crew, [employer].  Been compliant.  No medication side effects.  He has been exercising well.  Lost over 20 pounds.  Excellent.  The patient does not have symptoms concerning for COVID-19 infection (fever, chills, cough, or new shortness of breath).    Past Medical History:  Diagnosis Date  . CAD (coronary artery disease)   . Complication of anesthesia    SLOW TO WAKE UP  . Family history of anesthesia  complication    Mother had problems with BP dropping after surgery  . Hernia, inguinal   . Kidney stones   . Pure hypercholesterolemia    Past Surgical History:  Procedure Laterality Date  . CARDIAC CATHETERIZATION    . CORONARY ARTERY BYPASS GRAFT N/A 07/16/2013   Procedure: CORONARY ARTERY BYPASS GRAFTING times using bilateral internal mammary arteries and right leg saphenous vein harvested endoscopically.;  Surgeon: Melrose Nakayama, MD;  Location: [place] [facility];  Service: Open Heart Surgery;  Laterality: N/A;  BIMA, EVH 4 VESSELS  . FRACTIONAL FLOW RESERVE WIRE  07/09/2013   Procedure: FRACTIONAL FLOW RESERVE WIRE;  Surgeon: Candee Furbish, MD;  Location: [facility] CATH LAB;  Service: Cardiovascular;;  . HERNIA REPAIR     1963  . INGUINAL HERNIA REPAIR Left 02/14/2016   Procedure: LAPAROSCOPIC LEFT  INGUINAL HERNIA;  Surgeon: Ralene Ok, MD;  [facility];  Service: General;  Laterality: Left;  . INSERTION OF MESH Left 02/14/2016   Procedure: INSERTION OF MESH;  Surgeon: Ralene Ok, MD;  Location: [facility];  Service: General;  Laterality: Left;  . LEFT HEART CATHETERIZATION WITH CORONARY ANGIOGRAM N/A 07/09/2013   Procedure: LEFT HEART CATHETERIZATION WITH CORONARY ANGIOGRAM;  Surgeon: Candee Furbish, MD;  Location: [facility] CATH LAB;  Service: Cardiovascular;  Laterality: N/A;  . vasectomy       Current Meds  Medication Sig  . aspirin EC 81 MG tablet Take 81 mg by mouth daily.  Marland Kitchen atorvastatin (LIPITOR) 40 MG tablet TAKE 1  TABLET BY MOUTH ONCE DAILY  . fexofenadine (ALLEGRA) 180 MG tablet Take 180 mg by mouth daily as needed for allergies.  . metoprolol tartrate (LOPRESSOR) 50 MG tablet TAKE 1 TABLET BY MOUTH TWICE DAILY     Allergies:   Patient has no known allergies.   Social History   Tobacco Use  . Smoking status: Never Smoker  . Smokeless tobacco: Never Used  Substance Use Topics  . Alcohol use: No  . Drug use: No     Family Hx: The patient's family history includes Osteoporosis  in his mother; Stroke in his mother; Vascular Disease in his mother.  ROS:   Please see the history of present illness.    Denies any chest pain fevers chills nausea vomiting syncope bleeding All other systems reviewed and are negative.   Prior CV studies:   The following studies were reviewed today:  Prior EKG reviewed- CABG anatomy reviewed.  Labs/Other Tests and Data Reviewed:    EKG:  An ECG dated 01/20/2017 was personally reviewed today and demonstrated:  Sinus bradycardia rate 58 with small nonpathologic Q waves in lead II and aVF, no significant change from prior.  Recent Labs: No results found for requested labs within last 8760 hours.   Recent Lipid Panel Lab Results  Component Value Date/Time   CHOL 111 08/04/2013 01:59 PM   TRIG 134.0 08/04/2013 01:59 PM   HDL 33.90 (L) 08/04/2013 01:59 PM   CHOLHDL 3 08/04/2013 01:59 PM   LDLCALC 50 08/04/2013 01:59 PM    Wt Readings from Last 3 Encounters:  01/28/19 179 lb (81.2 kg)  01/20/17 196 lb 9.6 oz (89.2 kg)  02/14/16 201 lb (91.2 kg)     Objective:    Vital Signs:  Ht 5' 5.75" (1.67 m)   Wt 179 lb (81.2 kg)   BMI 29.11 kg/m    Well nourished, well developed male in no acute distress.   ASSESSMENT & PLAN:    Coronary artery disease status post bypass 2014 -Overall doing quite well without any significant symptoms.  No chest pain, no shortness of breath, no angina.  Continue with aggressive secondary risk factor prevention.  Aspirin, statin beta-blocker.  Hyperlipidemia - Prior LDL 153, currently 53.  Overall doing quite well.  Atorvastatin 40 mg.  Obesity - Doing a great job with weight loss.  He is down over 20 pounds.  COVID-19 Education: The signs and symptoms of COVID-19 were discussed with the patient and how to seek care for testing (follow up with PCP or arrange E-visit).  The importance of social distancing was discussed today.  Time:   Today, I have spent 11 minutes with the patient with  telehealth technology discussing the above problems.     Medication Adjustments/Labs and Tests Ordered: Current medicines are reviewed at length with the patient today.  Concerns regarding medicines are outlined above.   Tests Ordered: No orders of the defined types were placed in this encounter.   Medication Changes: No orders of the defined types were placed in this encounter.   Disposition:  Follow up in 2 year(s)  Signed, Candee Furbish, MD  01/28/2019 9:51 AM    [facility]

## 2019-01-28 NOTE — Patient Instructions (Signed)
Medication Instructions:  The current medical regimen is effective;  continue present plan and medications.  If you need a refill on your cardiac medications before your next appointment, please call your pharmacy.   Follow-Up: Follow up in 2 years with Dr. Marlou Porch.  You will receive a letter in the mail 2 months before you are due.  Please call us when you receive this letter to schedule your follow up appointment.  Thank you for choosing [facility]!!

## 2019-05-29 ENCOUNTER — Other Ambulatory Visit: Payer: Self-pay | Admitting: Cardiology

## 2019-06-02 DIAGNOSIS — Z0000 Encounter for general adult medical examination without abnormal findings: Secondary | ICD-10-CM | POA: Diagnosis not present

## 2019-06-08 DIAGNOSIS — E785 Hyperlipidemia, unspecified: Secondary | ICD-10-CM | POA: Diagnosis not present

## 2019-06-08 DIAGNOSIS — Z125 Encounter for screening for malignant neoplasm of prostate: Secondary | ICD-10-CM | POA: Diagnosis not present

## 2019-06-08 DIAGNOSIS — Z5181 Encounter for therapeutic drug level monitoring: Secondary | ICD-10-CM | POA: Diagnosis not present

## 2019-08-27 ENCOUNTER — Other Ambulatory Visit: Payer: Self-pay | Admitting: Cardiology

## 2019-12-15 DIAGNOSIS — I2510 Atherosclerotic heart disease of native coronary artery without angina pectoris: Secondary | ICD-10-CM | POA: Diagnosis not present

## 2019-12-15 DIAGNOSIS — E785 Hyperlipidemia, unspecified: Secondary | ICD-10-CM | POA: Diagnosis not present

## 2019-12-15 DIAGNOSIS — Z951 Presence of aortocoronary bypass graft: Secondary | ICD-10-CM | POA: Diagnosis not present

## 2020-01-27 ENCOUNTER — Other Ambulatory Visit: Payer: Self-pay | Admitting: Cardiology

## 2020-02-22 ENCOUNTER — Other Ambulatory Visit: Payer: Self-pay | Admitting: Cardiology

## 2020-02-23 MED ORDER — METOPROLOL TARTRATE 50 MG PO TABS: 50.0000 mg | ORAL_TABLET | Freq: Two times a day (BID) | ORAL | 0 refills | Status: DC

## 2020-03-11 ENCOUNTER — Other Ambulatory Visit: Payer: Self-pay | Admitting: Cardiology

## 2020-03-14 ENCOUNTER — Other Ambulatory Visit: Payer: Self-pay

## 2020-03-14 MED ORDER — METOPROLOL TARTRATE 50 MG PO TABS: 50.0000 mg | ORAL_TABLET | Freq: Two times a day (BID) | ORAL | 3 refills | Status: DC

## 2020-06-14 DIAGNOSIS — Z0000 Encounter for general adult medical examination without abnormal findings: Secondary | ICD-10-CM | POA: Diagnosis not present

## 2020-06-14 DIAGNOSIS — Z23 Encounter for immunization: Secondary | ICD-10-CM | POA: Diagnosis not present

## 2020-06-14 DIAGNOSIS — E785 Hyperlipidemia, unspecified: Secondary | ICD-10-CM | POA: Diagnosis not present

## 2020-06-14 DIAGNOSIS — Z13220 Encounter for screening for lipoid disorders: Secondary | ICD-10-CM | POA: Diagnosis not present

## 2020-06-14 DIAGNOSIS — I2510 Atherosclerotic heart disease of native coronary artery without angina pectoris: Secondary | ICD-10-CM | POA: Diagnosis not present

## 2020-06-14 DIAGNOSIS — Z125 Encounter for screening for malignant neoplasm of prostate: Secondary | ICD-10-CM | POA: Diagnosis not present

## 2020-08-14 DIAGNOSIS — Z23 Encounter for immunization: Secondary | ICD-10-CM | POA: Diagnosis not present

## 2020-12-12 DIAGNOSIS — I2510 Atherosclerotic heart disease of native coronary artery without angina pectoris: Secondary | ICD-10-CM | POA: Diagnosis not present

## 2020-12-12 DIAGNOSIS — E785 Hyperlipidemia, unspecified: Secondary | ICD-10-CM | POA: Diagnosis not present

## 2021-02-24 ENCOUNTER — Other Ambulatory Visit: Payer: Self-pay | Admitting: Cardiology

## 2021-03-02 ENCOUNTER — Encounter: Payer: Self-pay | Admitting: Cardiology

## 2021-03-02 ENCOUNTER — Other Ambulatory Visit: Payer: Self-pay

## 2021-03-02 ENCOUNTER — Ambulatory Visit (INDEPENDENT_AMBULATORY_CARE_PROVIDER_SITE_OTHER): Payer: BC Managed Care – PPO | Admitting: Cardiology

## 2021-03-02 VITALS — BP 110/70 | HR 67 | Ht 65.75 in | Wt 180.0 lb

## 2021-03-02 DIAGNOSIS — I2510 Atherosclerotic heart disease of native coronary artery without angina pectoris: Secondary | ICD-10-CM

## 2021-03-02 DIAGNOSIS — I10 Essential (primary) hypertension: Secondary | ICD-10-CM | POA: Diagnosis not present

## 2021-03-02 DIAGNOSIS — E7800 Pure hypercholesterolemia, unspecified: Secondary | ICD-10-CM

## 2021-03-02 NOTE — Patient Instructions (Signed)
Medication Instructions:  The current medical regimen is effective;  continue present plan and medications.  *If you need a refill on your cardiac medications before your next appointment, please call your pharmacy*  Follow-Up: At [facility], you and your health needs are our priority.  As part of our continuing mission to provide you with exceptional heart care, we have created designated Provider Care Teams.  These Care Teams include your primary Cardiologist (physician) and Advanced Practice Providers (APPs -  Physician Assistants and Nurse Practitioners) who all work together to provide you with the care you need, when you need it.  We recommend signing up for the patient portal called "MyChart".  Sign up information is provided on this After Visit Summary.  MyChart is used to connect with patients for Virtual Visits (Telemedicine).  Patients are able to view lab/test results, encounter notes, upcoming appointments, etc.  Non-urgent messages can be sent to your provider as well.   To learn more about what you can do with MyChart, go to [email or web address].    Your next appointment:   12 month(s)  The format for your next appointment:   In Person  Provider:   Mark Skains, MD   Thank you for choosing [facility]!!      

## 2021-03-02 NOTE — Progress Notes (Signed)
Cardiology Office Note:    Date:  03/02/2021   ID:  Mithran Strike, DOB [birth year], MRN [id number]  PCP:  London Pepper, MD   [facility] Providers Cardiologist:  Candee Furbish, MD     Referring MD: London Pepper, MD    History of Present Illness:    Juan [name] is a 60 y.o. male here for follow up of coronary artery disease, hyperlipidemia, or hypertension. He has a  history of coronary artery disease status post CABG July 17, 2013 with LIMA to LAD, free RIMA to obtuse marginal, SVG to PDA and posterior lateral branch with a EF of approximately 50% with mid inferior wall akinesis.  Previous symptoms were typical angina with exercise had a high risk exercise treadmill test with 2 mm of ST segment depression.  He is a non-smoker no early family history of CAD in a nondiabetic.  Works as a Cabin crew, [employer]. Lost over 30 pounds since surgery. He is feeling better.   Today, he is doing reasonably well. He continues to lose weight. He is eating 2 meals a day and is physically active.   Wt Readings from Last 3 Encounters:  03/02/21 180 lb (81.6 kg)  01/28/19 179 lb (81.2 kg)  01/20/17 196 lb 9.6 oz (89.2 kg)   He has no complaints with his current medications. He denies having any exertional shortness of breath, chest pain, tightness, or pressure. He has no lightheadedness, LE edema, syncopal episodes, PND, or orthopnea.    Past Medical History:  Diagnosis Date  . CAD (coronary artery disease)   . Complication of anesthesia    SLOW TO WAKE UP  . Family history of anesthesia complication    Mother had problems with BP dropping after surgery  . Hernia, inguinal   . Kidney stones   . Pure hypercholesterolemia     Past Surgical History:  Procedure Laterality Date  . CARDIAC CATHETERIZATION    . CORONARY ARTERY BYPASS GRAFT N/A 07/16/2013   Procedure: CORONARY ARTERY BYPASS GRAFTING times using bilateral internal mammary arteries and right leg saphenous vein  harvested endoscopically.;  Surgeon: Melrose Nakayama, MD;  Location: [place];  Service: Open Heart Surgery;  Laterality: N/A;  BIMA, EVH 4 VESSELS  . FRACTIONAL FLOW RESERVE WIRE  07/09/2013   Procedure: FRACTIONAL FLOW RESERVE WIRE;  Surgeon: Candee Furbish, MD;  Location: [facility] CATH LAB;  Service: Cardiovascular;;  . HERNIA REPAIR     1963  . INGUINAL HERNIA REPAIR Left 02/14/2016   Procedure: LAPAROSCOPIC LEFT  INGUINAL HERNIA;  Surgeon: Ralene Ok, MD;  Location: [facility];  Service: General;  Laterality: Left;  . INSERTION OF MESH Left 02/14/2016   Procedure: INSERTION OF MESH;  Surgeon: Ralene Ok, MD;  Location: [facility];  Service: General;  Laterality: Left;  . LEFT HEART CATHETERIZATION WITH CORONARY ANGIOGRAM N/A 07/09/2013   Procedure: LEFT HEART CATHETERIZATION WITH CORONARY ANGIOGRAM;  Surgeon: Candee Furbish, MD;  Location[facility] C[facility] LAB;  Service: Cardiovascular;  Laterality: N/A;  . vasectomy      Current Medications: Current Meds  Medication Sig  . aspirin EC 81 MG tablet Take 81 mg by mouth daily.  Marland Kitchen atorvastatin (LIPITOR) 40 MG tablet TAKE 1 TABLET BY MOUTH ONCE DAILY  . metoprolol tartrate (LOPRESSOR) 50 MG tablet Take 1 tablet (50 mg total) by mouth 2 (two) times daily.     Allergies:   Patient has no known allergies.   Social History   Socioeconomic History  . Marital status: Married  Spouse name: Not on file  . Number of children: 2  . Years of education: Not on file  . Highest education level: Not on file  Occupational History  . Not on file  Tobacco Use  . Smoking status: Never Smoker  . Smokeless tobacco: Never Used  Substance and Sexual Activity  . Alcohol use: No  . Drug use: No  . Sexual activity: Not on file  Other Topics Concern  . Not on file  Social History Narrative  . Not on file   Social Determinants of Health   Financial Resource Strain: Not on file  Food Insecurity: Not on file  Transportation Needs: Not on file  Physical Activity: Not  on file  Stress: Not on file  Social Connections: Not on file     Family History: The patient'sfamily history includes Osteoporosis in his mother; Stroke in his mother; Vascular Disease in his mother.  ROS:   Please see the history of present illness. All other systems reviewed and are negative.  EKGs/Labs/Other Studies Reviewed:    The following studies were reviewed today: CT Chest 11/16/13:  FINDINGS:  Low lung volumes. Patient status post median sternotomy coronary  artery bypass grafting. There are no focal regions of consolidation  no focal infiltrates. The osseous structures unremarkable.  EKG:   03/02/21-sinus rythym, rate 67   Recent Labs: No results found for requested labs within last 8760 hours.  Recent Lipid Panel    Component Value Date/Time   CHOL 111 08/04/2013 1359   TRIG 134.0 08/04/2013 1359   HDL 33.90 (L) 08/04/2013 1359   CHOLHDL 3 08/04/2013 1359   VLDL 26.8 08/04/2013 1359   LDLCALC 50 08/04/2013 1359     Risk Assessment/Calculations:      Physical Exam:    VS:  BP 110/70 (BP Location: Left Arm, Patient Position: Sitting, Cuff Size: Normal)   Pulse 67   Ht 5' 5.75" (1.67 m)   Wt 180 lb (81.6 kg)   SpO2 97%   BMI 29.27 kg/m     Wt Readings from Last 3 Encounters:  03/02/21 180 lb (81.6 kg)  01/28/19 179 lb (81.2 kg)  01/20/17 196 lb 9.6 oz (89.2 kg)     GEN: Well nourished, well developed in no acute distress HEENT: Normal NECK: No JVD; No carotid bruits LYMPHATICS: No lymphadenopathy CARDIAC: RRR, no murmurs, rubs, gallops RESPIRATORY:  Clear to auscultation without rales, wheezing or rhonchi  ABDOMEN: Soft, non-tender, non-distended MUSCULOSKELETAL:  No edema; No deformity  SKIN: Warm and dry NEUROLOGIC:  Alert and oriented x 3 PSYCHIATRIC:  Normal affect   ASSESSMENT:    1. Coronary artery disease involving native coronary artery of native heart without angina pectoris   2. Essential hypertension   3. Pure  hypercholesterolemia    PLAN:    In order of problems listed above:  Coronary artery disease status post bypass 2014 - Doing well without any anginal symptoms.  Continue with aspirin statin beta-blocker.  Hyperlipidemia - Prior LDL was in the 150 range.  Currently 43.  Excellent.  Previously was as low as 53.  Doing quite well.  Continue with high intensity atorvastatin 40 mg once a day.  Refills as needed with this medical management.  Essential hypertension - Excellent control.  On metoprolol 50 mg twice a day.  Refills as necessary.  Great job with weight loss.  Medication Adjustments/Labs and Tests Ordered: Current medicines are reviewed at length with the patient today.  Concerns regarding medicines are  outlined above.  Orders Placed This Encounter  Procedures  . EKG 12-Lead   No orders of the defined types were placed in this encounter.   Patient Instructions  Medication Instructions:  The current medical regimen is effective;  continue present plan and medications.  *If you need a refill on your cardiac medications before your next appointment, please call your pharmacy*  Follow-Up: At [facility], you and your health needs are our priority.  As part of our continuing mission to provide you with exceptional heart care, we have created designated Provider Care Teams.  These Care Teams include your primary Cardiologist (physician) and Advanced Practice Providers (APPs -  Physician Assistants and Nurse Practitioners) who all work together to provide you with the care you need, when you need it.  We recommend signing up for the patient portal called "MyChart".  Sign up information is provided on this After Visit Summary.  MyChart is used to connect with patients for Virtual Visits (Telemedicine).  Patients are able to view lab/test results, encounter notes, upcoming appointments, etc.  Non-urgent messages can be sent to your provider as well.   To learn more about what you  can do with MyChart, go to [email or web address].    Your next appointment:   12 month(s)  The format for your next appointment:   In Person  Provider:   Candee Furbish, MD   Thank you for choosing [facility]!!          I,Alexis Bryant,acting as a scribe for Candee Furbish, MD.,have documented all relevant documentation on the behalf of Candee Furbish, MD,as directed by  Candee Furbish, MD while in the presence of Candee Furbish, MD.  I, Candee Furbish, MD, have reviewed all documentation for this visit. The documentation on 03/02/21 for the exam, diagnosis, procedures, and orders are all accurate and complete.  Signed, Candee Furbish, MD  03/02/2021 3:05 PM    [facility]

## 2021-03-08 ENCOUNTER — Other Ambulatory Visit: Payer: Self-pay | Admitting: Cardiology

## 2021-06-20 DIAGNOSIS — E785 Hyperlipidemia, unspecified: Secondary | ICD-10-CM | POA: Diagnosis not present

## 2021-06-20 DIAGNOSIS — Z125 Encounter for screening for malignant neoplasm of prostate: Secondary | ICD-10-CM | POA: Diagnosis not present

## 2021-06-20 DIAGNOSIS — I2510 Atherosclerotic heart disease of native coronary artery without angina pectoris: Secondary | ICD-10-CM | POA: Diagnosis not present

## 2021-06-25 DIAGNOSIS — Z23 Encounter for immunization: Secondary | ICD-10-CM | POA: Diagnosis not present

## 2021-06-25 DIAGNOSIS — Z0000 Encounter for general adult medical examination without abnormal findings: Secondary | ICD-10-CM | POA: Diagnosis not present

## 2021-12-24 DIAGNOSIS — E785 Hyperlipidemia, unspecified: Secondary | ICD-10-CM | POA: Diagnosis not present

## 2021-12-24 DIAGNOSIS — I2510 Atherosclerotic heart disease of native coronary artery without angina pectoris: Secondary | ICD-10-CM | POA: Diagnosis not present

## 2022-04-08 ENCOUNTER — Ambulatory Visit (INDEPENDENT_AMBULATORY_CARE_PROVIDER_SITE_OTHER): Payer: BC Managed Care – PPO | Admitting: Cardiology

## 2022-04-08 ENCOUNTER — Encounter (HOSPITAL_BASED_OUTPATIENT_CLINIC_OR_DEPARTMENT_OTHER): Payer: Self-pay | Admitting: Cardiology

## 2022-04-08 DIAGNOSIS — I950 Idiopathic hypotension: Secondary | ICD-10-CM | POA: Diagnosis not present

## 2022-04-08 DIAGNOSIS — Z951 Presence of aortocoronary bypass graft: Secondary | ICD-10-CM | POA: Diagnosis not present

## 2022-04-08 DIAGNOSIS — I959 Hypotension, unspecified: Secondary | ICD-10-CM | POA: Insufficient documentation

## 2022-04-08 DIAGNOSIS — I2510 Atherosclerotic heart disease of native coronary artery without angina pectoris: Secondary | ICD-10-CM | POA: Diagnosis not present

## 2022-04-08 DIAGNOSIS — E7800 Pure hypercholesterolemia, unspecified: Secondary | ICD-10-CM | POA: Diagnosis not present

## 2022-04-08 MED ORDER — METOPROLOL TARTRATE 25 MG PO TABS: 25.0000 mg | ORAL_TABLET | Freq: Two times a day (BID) | ORAL | 3 refills | Status: DC

## 2022-04-08 NOTE — Assessment & Plan Note (Signed)
Doing very well with atorvastatin 40 mg high intensity dose.  Previously LDL was as low as 53.  Rate for plaque stabilization.

## 2022-05-19 NOTE — Progress Notes (Signed)
Office Visit    Patient Name: Juan [name] Date of Encounter: 05/20/2022  PCP:  London Pepper, [place]  Cardiologist:  Candee Furbish, MD  Advanced Practice Provider:  No care team member to display Electrophysiologist:  None      Chief Complaint    Juan [name] is a 61 y.o. male presents today for follow-up after reduced dose beta-blocker  Past Medical History    Past Medical History:  Diagnosis Date   CAD (coronary artery disease)    Complication of anesthesia    SLOW TO WAKE UP   Family history of anesthesia complication    Mother had problems with BP dropping after surgery   Hernia, inguinal    Kidney stones    Pure hypercholesterolemia    Past Surgical History:  Procedure Laterality Date   CARDIAC CATHETERIZATION     CORONARY ARTERY BYPASS GRAFT N/A 07/16/2013   Procedure: CORONARY ARTERY BYPASS GRAFTING times using bilateral internal mammary arteries and right leg saphenous vein harvested endoscopically.;  Surgeon: Melrose Nakayama, MD;  Location: [place];  Service: Open Heart Surgery;  Laterality: N/A;  BIMA, EVH 4 VESSELS   FRACTIONAL FLOW RESERVE WIRE  07/09/2013   Procedure: FRACTIONAL FLOW RESERVE WIRE;  Surgeon: Candee Furbish, MD;  Location: [facility] CATH LAB;  Service: Cardiovascular;;   HERNIA REPAIR     1963   INGUINAL HERNIA REPAIR Left 02/14/2016   Procedure: LAPAROSCOPIC LEFT  INGUINAL HERNIA;  Surgeon: Ralene Ok, MD;  Location: [facility];  Service: General;  Laterality: Left;   INSERTION OF MESH Left 02/14/2016   Procedure: INSERTION OF MESH;  Surgeon: Ralene Ok, MD;  Location: [facility];  Service: General;  Laterality: Left;   LEFT HEART CATHETERIZATION WITH CORONARY ANGIOGRAM N/A 07/09/2013   Procedure: LEFT HEART CATHETERIZATION WITH CORONARY ANGIOGRAM;  Surgeon: Candee Furbish, MD;  Location: [facility] C[facility] LAB;  Service: Cardiovascular;  Laterality: N/A;   vasectomy      Allergies  No Known Allergies  History of Present  Illness    Juan [name] is a 61 y.o. male with a hx of CAD (s/p CABG 07/2013 LIMA-LAD, RIMA-OM, SVG-PDA-PL), HTN, HLD last seen 04/08/22.  Prior CABG 07/17/13 with LIMA-LAD, free RIMA-OM, SVg-PDA and PL with EF 50% with mid inferior wall akinesis. Previous symptoms were typical exercise with angina. Has had successful weight loss since surgery.   Last seen 04/08/22 by Dr. Marlou Porch. Noted dizziness with position changes and hypotensive in clinic. Metoprolol dose was reduced.   Presents today for follow up. 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  Symptoms much improved since he saw Dr. Marlou Porch.  No recurrent lightheadedness dizziness. Reports no shortness of breath nor dyspnea on exertion. Reports no chest pain, pressure, or tightness. No edema, orthopnea, PND. Reports no palpitations.     EKGs/Labs/Other Studies Reviewed:   The following studies were reviewed today:  EKG:  No EKG today.   Recent Labs: No results found for requested labs within last 365 days.  Recent Lipid Panel    Component Value Date/Time   CHOL 111 08/04/2013 1359   TRIG 134.0 08/04/2013 1359   HDL 33.90 (L) 08/04/2013 1359   CHOLHDL 3 08/04/2013 1359   VLDL 26.8 08/04/2013 1359   LDLCALC 50 08/04/2013 1359     Home Medications   Current Meds  Medication Sig   aspirin EC 81 MG tablet Take 81 mg by mouth daily.   atorvastatin (LIPITOR) 40 MG tablet TAKE 1  TABLET BY MOUTH ONCE DAILY   metoprolol tartrate (LOPRESSOR) 25 MG tablet Take 1 tablet (25 mg total) by mouth 2 (two) times daily.   Multiple Vitamins-Minerals (CENTRUM SILVER 50+MEN PO) Take 1 tablet by mouth daily.     Review of Systems      All other systems reviewed and are otherwise negative except as noted above.  Physical Exam    VS:  BP 110/66   Pulse 63   Ht 5' 5.75" (1.67 m)   Wt 169 lb (76.7 kg)   BMI 27.49 kg/m  , BMI Body mass index is 27.49 kg/m.  Wt Readings from Last 3 Encounters:  05/20/22 169 lb (76.7 kg)  04/08/22 189 lb  (85.7 kg)  03/02/21 180 lb (81.6 kg)     GEN: Well nourished, well developed, in no acute distress. HEENT: normal. Neck: Supple, no JVD, carotid bruits, or masses. Cardiac: RRR, no murmurs, rubs, or gallops. No clubbing, cyanosis, edema.  Radials/PT 2+ and equal bilaterally.  Respiratory:  Respirations regular and unlabored, clear to auscultation bilaterally. GI: Soft, nontender, nondistended. MS: No deformity or atrophy. Skin: Warm and dry, no rash. Neuro:  Strength and sensation are intact. Psych: Normal affect.  Assessment & Plan    CAD s/p CABG 2014 - Stable with no anginal symptoms. No indication for ischemic evaluation.  GDMT includes aspirin, atorvastatin, metoprolol. Heart healthy diet and regular cardiovascular exercise encouraged.    HLD, LDL goal <70 -continue atorvastatin 40 mg daily.  Denies myalgias.  Hypotension -previously with lightheadedness and dizziness.  Resolved since reduced dose of metoprolol.  He has been tolerating metoprolol tartrate 25 mg twice daily.  Continue current dose.  If he has recurrent lightheadedness or dizziness could discontinue.        Disposition: Follow up in 1 year(s) with Candee Furbish, MD or APP.  Signed, Loel Dubonnet, NP 05/20/2022, 12:43 PM [facility]

## 2022-05-20 ENCOUNTER — Encounter (HOSPITAL_BASED_OUTPATIENT_CLINIC_OR_DEPARTMENT_OTHER): Payer: Self-pay | Admitting: Family

## 2022-05-20 ENCOUNTER — Ambulatory Visit (INDEPENDENT_AMBULATORY_CARE_PROVIDER_SITE_OTHER): Payer: BC Managed Care – PPO | Admitting: Family

## 2022-05-20 VITALS — BP 110/66 | HR 63 | Ht 65.75 in | Wt 169.0 lb

## 2022-05-20 DIAGNOSIS — I25118 Atherosclerotic heart disease of native coronary artery with other forms of angina pectoris: Secondary | ICD-10-CM

## 2022-05-20 DIAGNOSIS — E785 Hyperlipidemia, unspecified: Secondary | ICD-10-CM | POA: Diagnosis not present

## 2022-05-20 NOTE — Patient Instructions (Signed)
Medication Instructions:  Continue your current medications.   *If you need a refill on your cardiac medications before your next appointment, please call your pharmacy*   Lab Work: None ordered today.   Testing/Procedures: None ordered today.    Follow-Up: At [facility], you and your health needs are our priority.  As part of our continuing mission to provide you with exceptional heart care, we have created designated Provider Care Teams.  These Care Teams include your primary Cardiologist (physician) and Advanced Practice Providers (APPs -  Physician Assistants and Nurse Practitioners) who all work together to provide you with the care you need, when you need it.  We recommend signing up for the patient portal called "MyChart".  Sign up information is provided on this After Visit Summary.  MyChart is used to connect with patients for Virtual Visits (Telemedicine).  Patients are able to view lab/test results, encounter notes, upcoming appointments, etc.  Non-urgent messages can be sent to your provider as well.   To learn more about what you can do with MyChart, go to [email or web address].    Your next appointment:   1 year(s)  The format for your next appointment:   In Person  Provider:   Candee Furbish, MD     Other Instructions  If you start to have lightheadedness or dizziness again, please call and let us know.   Heart Healthy Diet Recommendations: A low-salt diet is recommended. Meats should be grilled, baked, or boiled. Avoid fried foods. Focus on lean protein sources like fish or chicken with vegetables and fruits. The American Heart Association is a Microbiologist!  American Heart Association Diet and Lifeystyle Recommendations   Exercise recommendations: The American Heart Association recommends 150 minutes of moderate intensity exercise weekly. Try 30 minutes of moderate intensity exercise 4-5 times per week. This could include walking, jogging, or  swimming.   Important Information About Sugar

## 2022-07-09 DIAGNOSIS — Z125 Encounter for screening for malignant neoplasm of prostate: Secondary | ICD-10-CM | POA: Diagnosis not present

## 2022-07-09 DIAGNOSIS — Z951 Presence of aortocoronary bypass graft: Secondary | ICD-10-CM | POA: Diagnosis not present

## 2022-07-09 DIAGNOSIS — E785 Hyperlipidemia, unspecified: Secondary | ICD-10-CM | POA: Diagnosis not present

## 2022-07-09 DIAGNOSIS — Z0000 Encounter for general adult medical examination without abnormal findings: Secondary | ICD-10-CM | POA: Diagnosis not present

## 2022-07-09 DIAGNOSIS — Z23 Encounter for immunization: Secondary | ICD-10-CM | POA: Diagnosis not present

## 2022-07-09 DIAGNOSIS — I2510 Atherosclerotic heart disease of native coronary artery without angina pectoris: Secondary | ICD-10-CM | POA: Diagnosis not present

## 2022-12-30 ENCOUNTER — Telehealth: Payer: Self-pay | Admitting: Cardiology

## 2022-12-30 NOTE — Telephone Encounter (Signed)
Patients wife, [name] states patient is having ongoing issues with becoming dizzy when he stands after sitting.She states his blood pressure has been low when checked at the pharmacy but did not have any readings to review. She states she wants to decrease his metoprolol to see it this improves his symptoms. This was discussed with Laurann Montana NP at his last appointment.   I told her Dr Marlou Porch is out of the office today but I would forward her concerns to him for review. Provided education on blood pressure monitoring and advised to purchase a cuff for home use. Also provided education on Postural hypotension and ways to manage it.  Patient's wife verbalized understanding and had no questions.

## 2022-12-30 NOTE — Telephone Encounter (Signed)
Pt c/o medication issue:  1. Name of Medication:   metoprolol tartrate (LOPRESSOR) 25 MG tablet   2. How are you currently taking this medication (dosage and times per day)?   As prescribed  3. Are you having a reaction (difficulty breathing--STAT)?   No  4. What is your medication issue?   Wife states patient is still having low BP and dizzy spells when he sits for long periods and tries to stand up.  Wife stated patient may need to be on a lower dose of this medication or stop the medication.

## 2022-12-31 MED ORDER — METOPROLOL TARTRATE 25 MG PO TABS: 12.5000 mg | ORAL_TABLET | Freq: Two times a day (BID) | ORAL | 3 refills | Status: DC

## 2022-12-31 NOTE — Telephone Encounter (Signed)
Spoke with patient's wife [name] to advise per Dr Marlou Porch, patient can decrease Metoprolol to 12.5 mg BID.  Continue to monitor BP and let Dr. Marlou Porch know he does on decreased dosage.  Patient's wife verbalized understanding and had no questions.

## 2022-12-31 NOTE — Addendum Note (Signed)
Addended by: Vergia Alcon A on: 12/31/2022 12:44 PM   Modules accepted: Orders

## 2023-01-09 DIAGNOSIS — E785 Hyperlipidemia, unspecified: Secondary | ICD-10-CM | POA: Diagnosis not present

## 2023-01-09 DIAGNOSIS — I2510 Atherosclerotic heart disease of native coronary artery without angina pectoris: Secondary | ICD-10-CM | POA: Diagnosis not present

## 2023-04-02 ENCOUNTER — Other Ambulatory Visit (HOSPITAL_BASED_OUTPATIENT_CLINIC_OR_DEPARTMENT_OTHER): Payer: Self-pay | Admitting: Cardiology

## 2023-05-28 ENCOUNTER — Other Ambulatory Visit (HOSPITAL_BASED_OUTPATIENT_CLINIC_OR_DEPARTMENT_OTHER): Payer: Self-pay | Admitting: Cardiology

## 2023-07-15 DIAGNOSIS — I2510 Atherosclerotic heart disease of native coronary artery without angina pectoris: Secondary | ICD-10-CM | POA: Diagnosis not present

## 2023-07-15 DIAGNOSIS — Z125 Encounter for screening for malignant neoplasm of prostate: Secondary | ICD-10-CM | POA: Diagnosis not present

## 2023-07-15 DIAGNOSIS — E785 Hyperlipidemia, unspecified: Secondary | ICD-10-CM | POA: Diagnosis not present

## 2023-07-15 DIAGNOSIS — Z23 Encounter for immunization: Secondary | ICD-10-CM | POA: Diagnosis not present

## 2023-07-15 DIAGNOSIS — Z0000 Encounter for general adult medical examination without abnormal findings: Secondary | ICD-10-CM | POA: Diagnosis not present

## 2023-09-02 ENCOUNTER — Ambulatory Visit: Payer: BC Managed Care – PPO | Attending: Cardiology | Admitting: Cardiology

## 2023-09-02 ENCOUNTER — Encounter: Payer: Self-pay | Admitting: Cardiology

## 2023-09-02 VITALS — BP 110/66 | HR 62 | Ht 66.0 in | Wt 176.2 lb

## 2023-09-02 DIAGNOSIS — I25118 Atherosclerotic heart disease of native coronary artery with other forms of angina pectoris: Secondary | ICD-10-CM | POA: Diagnosis not present

## 2023-09-02 DIAGNOSIS — E785 Hyperlipidemia, unspecified: Secondary | ICD-10-CM | POA: Diagnosis not present

## 2023-09-02 DIAGNOSIS — Z951 Presence of aortocoronary bypass graft: Secondary | ICD-10-CM | POA: Diagnosis not present

## 2023-09-02 NOTE — Progress Notes (Signed)
Cardiology Office Note:  .   Date:  09/02/2023  ID:  Juan [name], DOB 08/31/1961, MRN [id number] PCP: Farris Has, MD  [facility] Providers Cardiologist:  Donato Schultz, MD     History of Present Illness: .   Juan [name] is a 62 y.o. male Discussed the use of AI scribe  History of Present Illness   The 61 year old patient with a history of coronary artery disease, status post CABG in 2014, and hyperlipidemia on atorvastatin 40 mg, presented for a follow-up visit. The patient had experienced hypotension in the past, leading to a reduction in the dose of metoprolol. Recently, the patient reported occasional dizziness, which was suspected to be related to the metoprolol.  The patient has been maintaining a good exercise regimen, including daily push-ups, and has made significant dietary changes since the bypass surgery. Despite these positive lifestyle changes, the patient has been experiencing some dizziness, which he described as infrequent but noticeable.  The patient has been self-monitoring his blood pressure at home and has been taking a reduced dose of metoprolol (12.5 mg twice a day) by cutting the 25 mg tablets in half. The patient expressed a willingness to discontinue the metoprolol entirely, given his consistently low blood pressures.  The patient's LDL was reported as 54, and his hemoglobin was 13.8, both within normal limits. The patient's liver function was also reported as normal. The patient's overall health status was described as good, with no new symptoms or concerns raised during the consultation.          Studies Reviewed: Marland Kitchen   EKG Interpretation Date/Time:  Tuesday September 02 2023 10:13:11 EST Ventricular Rate:  63 PR Interval:  186 QRS Duration:  98 QT Interval:  382 QTC Calculation: 390 R Axis:   1  Text Interpretation: Normal sinus rhythm Normal ECG When compared with ECG of 17-Jul-2013 07:34, T wave inversion no longer evident in Inferior  leads Nonspecific T wave abnormality no longer evident in Lateral leads QT has shortened Confirmed by Donato Schultz ([id number]) on 09/02/2023 10:39:31 AM    Results LABS LDL: 54 Hb: 13.8 Risk Assessment/Calculations:            Physical Exam:   VS:  BP 110/66   Pulse 62   Ht 5\' 6"  (1.676 m)   Wt 176 lb 3.2 oz (79.9 kg)   SpO2 96%   BMI 28.44 kg/m    Wt Readings from Last 3 Encounters:  09/02/23 176 lb 3.2 oz (79.9 kg)  05/20/22 169 lb (76.7 kg)  04/08/22 189 lb (85.7 kg)    GEN: Well nourished, well developed in no acute distress NECK: No JVD; No carotid bruits CARDIAC: RRR, no murmurs, no rubs, no gallops RESPIRATORY:  Clear to auscultation without rales, wheezing or rhonchi  ABDOMEN: Soft, non-tender, non-distended EXTREMITIES:  No edema; No deformity   ASSESSMENT AND PLAN: .    Assessment and Plan    Coronary Artery Disease, Status Post CABG Follow-up for coronary artery disease, status post CABG in 2014. Blood pressure is well-controlled with occasional dizziness. Currently on a reduced dose of metoprolol (12.5 mg twice daily) due to hypotension. Discussed discontinuing metoprolol due to minimal impact on blood pressure management. Agreed to monitor blood pressure at home and report any issues. - Discontinue metoprolol - Monitor blood pressure at home - Continue Mediterranean diet and regular exercise  Hyperlipidemia Hyperlipidemia managed with atorvastatin 40 mg daily. No myalgias reported. LDL is well-controlled at 54 mg/dL. Discussed the importance of  atorvastatin in preventing heart attacks by stabilizing plaque. - Continue atorvastatin 40 mg daily - Maintain Mediterranean diet  General Health Maintenance Discussed potential need to discontinue aspirin for dental procedures. Advised to consult with the dentist regarding aspirin management, as preferences may vary. - Consult with dentist regarding aspirin management for dental procedures  Follow-up - Follow up in  one year.               Signed, Donato Schultz, MD

## 2023-09-02 NOTE — Patient Instructions (Signed)
Medication Instructions:  Please discontinue your Metoprolol. Continue all other medications as listed.  *If you need a refill on your cardiac medications before your next appointment, please call your pharmacy*  Follow-Up: At [facility], you and your health needs are our priority.  As part of our continuing mission to provide you with exceptional heart care, we have created designated Provider Care Teams.  These Care Teams include your primary Cardiologist (physician) and Advanced Practice Providers (APPs -  Physician Assistants and Nurse Practitioners) who all work together to provide you with the care you need, when you need it.  We recommend signing up for the patient portal called "MyChart".  Sign up information is provided on this After Visit Summary.  MyChart is used to connect with patients for Virtual Visits (Telemedicine).  Patients are able to view lab/test results, encounter notes, upcoming appointments, etc.  Non-urgent messages can be sent to your provider as well.   To learn more about what you can do with MyChart, go to [email or web address].    Your next appointment:   1 year(s)  Provider:   Donato Schultz, MD

## 2024-01-13 DIAGNOSIS — E785 Hyperlipidemia, unspecified: Secondary | ICD-10-CM | POA: Diagnosis not present

## 2024-01-13 DIAGNOSIS — I2510 Atherosclerotic heart disease of native coronary artery without angina pectoris: Secondary | ICD-10-CM | POA: Diagnosis not present
